# Patient Record
Sex: Female | Born: 1970 | Race: Black or African American | Hispanic: No | Marital: Married | Smoking: Never smoker
Health system: Southern US, Community
[De-identification: ages and names within clinical notes are randomized; demographics above are authoritative.]

## PROBLEM LIST (undated history)

## (undated) DIAGNOSIS — J302 Other seasonal allergic rhinitis: Secondary | ICD-10-CM

## (undated) DIAGNOSIS — D219 Benign neoplasm of connective and other soft tissue, unspecified: Secondary | ICD-10-CM

## (undated) DIAGNOSIS — E669 Obesity, unspecified: Secondary | ICD-10-CM

## (undated) DIAGNOSIS — H35039 Hypertensive retinopathy, unspecified eye: Secondary | ICD-10-CM

## (undated) DIAGNOSIS — G43109 Migraine with aura, not intractable, without status migrainosus: Secondary | ICD-10-CM

## (undated) HISTORY — DX: Hypertensive retinopathy, unspecified eye: H35.039

## (undated) HISTORY — DX: Obesity, unspecified: E66.9

## (undated) HISTORY — PX: ABDOMINAL HYSTERECTOMY: SHX81

## (undated) HISTORY — DX: Migraine with aura, not intractable, without status migrainosus: G43.109

## (undated) HISTORY — DX: Benign neoplasm of connective and other soft tissue, unspecified: D21.9

## (undated) HISTORY — PX: MYOMECTOMY: SHX85

---

## 1998-11-02 ENCOUNTER — Inpatient Hospital Stay (HOSPITAL_COMMUNITY): Admission: AD | Admit: 1998-11-02 | Discharge: 1998-11-02 | Payer: Self-pay | Admitting: Obstetrics and Gynecology

## 1999-04-02 ENCOUNTER — Inpatient Hospital Stay (HOSPITAL_COMMUNITY): Admission: AD | Admit: 1999-04-02 | Discharge: 1999-04-02 | Payer: Self-pay | Admitting: *Deleted

## 1999-04-21 ENCOUNTER — Inpatient Hospital Stay (HOSPITAL_COMMUNITY): Admission: AD | Admit: 1999-04-21 | Discharge: 1999-04-21 | Payer: Self-pay | Admitting: *Deleted

## 1999-05-02 ENCOUNTER — Inpatient Hospital Stay (HOSPITAL_COMMUNITY): Admission: AD | Admit: 1999-05-02 | Discharge: 1999-05-02 | Payer: Self-pay | Admitting: Obstetrics and Gynecology

## 1999-05-03 ENCOUNTER — Encounter: Payer: Self-pay | Admitting: Obstetrics and Gynecology

## 1999-05-08 ENCOUNTER — Inpatient Hospital Stay (HOSPITAL_COMMUNITY): Admission: AD | Admit: 1999-05-08 | Discharge: 1999-05-10 | Payer: Self-pay | Admitting: Obstetrics & Gynecology

## 1999-06-09 ENCOUNTER — Other Ambulatory Visit: Admission: RE | Admit: 1999-06-09 | Discharge: 1999-06-09 | Payer: Self-pay | Admitting: Obstetrics and Gynecology

## 2000-05-10 ENCOUNTER — Inpatient Hospital Stay (HOSPITAL_COMMUNITY): Admission: AD | Admit: 2000-05-10 | Discharge: 2000-05-10 | Payer: Self-pay | Admitting: Obstetrics and Gynecology

## 2000-08-23 ENCOUNTER — Other Ambulatory Visit: Admission: RE | Admit: 2000-08-23 | Discharge: 2000-08-23 | Payer: Self-pay | Admitting: Obstetrics and Gynecology

## 2000-09-17 ENCOUNTER — Ambulatory Visit (HOSPITAL_COMMUNITY): Admission: RE | Admit: 2000-09-17 | Discharge: 2000-09-17 | Payer: Self-pay | Admitting: Obstetrics and Gynecology

## 2000-09-17 ENCOUNTER — Encounter: Payer: Self-pay | Admitting: Obstetrics and Gynecology

## 2000-11-18 ENCOUNTER — Inpatient Hospital Stay (HOSPITAL_COMMUNITY): Admission: AD | Admit: 2000-11-18 | Discharge: 2000-11-18 | Payer: Self-pay | Admitting: Obstetrics and Gynecology

## 2000-12-03 ENCOUNTER — Inpatient Hospital Stay (HOSPITAL_COMMUNITY): Admission: AD | Admit: 2000-12-03 | Discharge: 2000-12-03 | Payer: Self-pay | Admitting: Obstetrics and Gynecology

## 2000-12-03 ENCOUNTER — Encounter: Payer: Self-pay | Admitting: Obstetrics and Gynecology

## 2000-12-03 ENCOUNTER — Ambulatory Visit (HOSPITAL_COMMUNITY): Admission: RE | Admit: 2000-12-03 | Discharge: 2000-12-03 | Payer: Self-pay | Admitting: Obstetrics and Gynecology

## 2001-01-15 ENCOUNTER — Inpatient Hospital Stay (HOSPITAL_COMMUNITY): Admission: AD | Admit: 2001-01-15 | Discharge: 2001-01-15 | Payer: Self-pay | Admitting: Obstetrics and Gynecology

## 2001-02-07 ENCOUNTER — Inpatient Hospital Stay (HOSPITAL_COMMUNITY): Admission: AD | Admit: 2001-02-07 | Discharge: 2001-02-11 | Payer: Self-pay | Admitting: Obstetrics and Gynecology

## 2001-09-29 ENCOUNTER — Other Ambulatory Visit: Admission: RE | Admit: 2001-09-29 | Discharge: 2001-09-29 | Payer: Self-pay | Admitting: Obstetrics and Gynecology

## 2002-06-28 ENCOUNTER — Encounter: Admission: RE | Admit: 2002-06-28 | Discharge: 2002-06-28 | Payer: Self-pay | Admitting: Cardiology

## 2002-06-28 ENCOUNTER — Encounter: Payer: Self-pay | Admitting: Cardiology

## 2003-08-02 ENCOUNTER — Emergency Department (HOSPITAL_COMMUNITY): Admission: EM | Admit: 2003-08-02 | Discharge: 2003-08-02 | Payer: Self-pay | Admitting: Emergency Medicine

## 2004-07-14 ENCOUNTER — Emergency Department (HOSPITAL_COMMUNITY): Admission: EM | Admit: 2004-07-14 | Discharge: 2004-07-14 | Payer: Self-pay | Admitting: Emergency Medicine

## 2004-08-29 ENCOUNTER — Emergency Department (HOSPITAL_COMMUNITY): Admission: EM | Admit: 2004-08-29 | Discharge: 2004-08-29 | Payer: Self-pay | Admitting: Family Medicine

## 2005-04-26 ENCOUNTER — Emergency Department (HOSPITAL_COMMUNITY): Admission: EM | Admit: 2005-04-26 | Discharge: 2005-04-27 | Payer: Self-pay | Admitting: *Deleted

## 2006-01-11 ENCOUNTER — Emergency Department (HOSPITAL_COMMUNITY): Admission: EM | Admit: 2006-01-11 | Discharge: 2006-01-11 | Payer: Self-pay | Admitting: Family Medicine

## 2006-02-05 ENCOUNTER — Emergency Department (HOSPITAL_COMMUNITY): Admission: EM | Admit: 2006-02-05 | Discharge: 2006-02-05 | Payer: Self-pay | Admitting: Family Medicine

## 2006-03-05 ENCOUNTER — Encounter: Admission: RE | Admit: 2006-03-05 | Discharge: 2006-03-05 | Payer: Self-pay | Admitting: Internal Medicine

## 2010-06-15 ENCOUNTER — Encounter: Payer: Self-pay | Admitting: Internal Medicine

## 2010-12-26 DIAGNOSIS — N809 Endometriosis, unspecified: Secondary | ICD-10-CM | POA: Insufficient documentation

## 2011-08-28 DIAGNOSIS — R079 Chest pain, unspecified: Secondary | ICD-10-CM | POA: Insufficient documentation

## 2012-03-02 DIAGNOSIS — Z9989 Dependence on other enabling machines and devices: Secondary | ICD-10-CM | POA: Insufficient documentation

## 2012-03-02 DIAGNOSIS — G4733 Obstructive sleep apnea (adult) (pediatric): Secondary | ICD-10-CM | POA: Insufficient documentation

## 2013-11-01 ENCOUNTER — Encounter: Payer: Self-pay | Admitting: Emergency Medicine

## 2013-11-01 ENCOUNTER — Emergency Department
Admission: EM | Admit: 2013-11-01 | Discharge: 2013-11-01 | Disposition: A | Discharge status: Home / Self Care | Payer: BC Managed Care – PPO | Source: Home / Self Care | Attending: Family Medicine | Admitting: Family Medicine

## 2013-11-01 DIAGNOSIS — R300 Dysuria: Secondary | ICD-10-CM

## 2013-11-01 HISTORY — DX: Other seasonal allergic rhinitis: J30.2

## 2013-11-01 LAB — POCT URINALYSIS DIP (MANUAL ENTRY)
BILIRUBIN UA: NEGATIVE
Glucose, UA: NEGATIVE
Ketones, POC UA: NEGATIVE
LEUKOCYTES UA: NEGATIVE
NITRITE UA: NEGATIVE
PH UA: 6.5 (ref 5–8)
PROTEIN UA: NEGATIVE
Spec Grav, UA: 1.01 (ref 1.005–1.03)
Urobilinogen, UA: 0.2 (ref 0–1)

## 2013-11-01 MED ORDER — NITROFURANTOIN MONOHYD MACRO 100 MG PO CAPS: 100.0000 mg | ORAL_CAPSULE | Freq: Two times a day (BID) | ORAL | Status: DC

## 2013-11-01 NOTE — Discharge Instructions (Signed)
Increase fluid intake.  May take ibuprofen for discomfort. If symptoms become significantly worse during the night or over the weekend, proceed to the local emergency room.    Dysuria Dysuria is the medical term for pain with urination. There are many causes for dysuria, but urinary tract infection is the most common. If a urinalysis was performed it can show that there is a urinary tract infection. A urine culture confirms that you or your child is sick. You will need to follow up with a healthcare provider because:  If a urine culture was done you will need to know the culture results and treatment recommendations.  If the urine culture was positive, you or your child will need to be put on antibiotics or know if the antibiotics prescribed are the right antibiotics for your urinary tract infection.  If the urine culture is negative (no urinary tract infection), then other causes may need to be explored or antibiotics need to be stopped. Today laboratory work may have been done and there does not seem to be an infection. If cultures were done they will take at least 24 to 48 hours to be completed. Today x-rays may have been taken and they read as normal. No cause can be found for the problems. The x-rays may be re-read by a radiologist and you will be contacted if additional findings are made. You or your child may have been put on medications to help with this problem until you can see your primary caregiver. If the problems get better, see your primary caregiver if the problems return. If you were given antibiotics (medications which kill germs), take all of the mediations as directed for the full course of treatment.  If laboratory work was done, you need to find the results. Leave a telephone number where you can be reached. If this is not possible, make sure you find out how you are to get test results. HOME CARE INSTRUCTIONS   Drink lots of fluids. For adults, drink eight, 8 ounce glasses of  clear juice or water a day. For children, replace fluids as suggested by your caregiver.  Empty the bladder often. Avoid holding urine for long periods of time.  After a bowel movement, women should cleanse front to back, using each tissue only once.  Empty your bladder before and after sexual intercourse.  Take all the medicine given to you until it is gone. You may feel better in a few days, but TAKE ALL MEDICINE.  Avoid caffeine, tea, alcohol and carbonated beverages, because they tend to irritate the bladder.  In men, alcohol may irritate the prostate.  Only take over-the-counter or prescription medicines for pain, discomfort, or fever as directed by your caregiver.  If your caregiver has given you a follow-up appointment, it is very important to keep that appointment. Not keeping the appointment could result in a chronic or permanent injury, pain, and disability. If there is any problem keeping the appointment, you must call back to this facility for assistance. SEEK IMMEDIATE MEDICAL CARE IF:   Back pain develops.  A fever develops.  There is nausea (feeling sick to your stomach) or vomiting (throwing up).  Problems are no better with medications or are getting worse. MAKE SURE YOU:   Understand these instructions.  Will watch your condition.  Will get help right away if you are not doing well or get worse. Document Released: 02/07/2004 Document Revised: 08/03/2011 Document Reviewed: 12/15/2007 [facility] Patient Information 2014 [place].

## 2013-11-01 NOTE — ED Notes (Signed)
Pt c/o bilateral flank pain and frequent urination x 1 day. Denies fever.

## 2013-11-01 NOTE — ED Provider Notes (Signed)
CSN: [id number]     Arrival date & time 11/01/13  1655 History   First MD Initiated Contact with Patient 11/01/13 1726     Chief Complaint  Patient presents with  . Flank Pain  . Urinary Frequency      HPI Comments: Patient complains of two day history of urgency, hesitancy, and bilateral flank and back pain.  She has been somewhat fatigued but feels well otherwise.  Patient is a 43 y.o. female presenting with dysuria. The history is provided by the patient.  Dysuria Pain quality:  Burning Pain severity:  Mild Onset quality:  Sudden Duration:  2 days Timing:  Constant Progression:  Worsening Chronicity:  New Recent urinary tract infections: no   Relieved by:  None tried Ineffective treatments:  NSAIDs Urinary symptoms: frequent urination and hesitancy   Urinary symptoms: no discolored urine, no foul-smelling urine, no hematuria and no bladder incontinence   Associated symptoms: flank pain and nausea   Associated symptoms: no abdominal pain, no fever, no genital lesions, no vaginal discharge and no vomiting     Past Medical History  Diagnosis Date  . Seasonal allergies    Past Surgical History  Procedure Laterality Date  . Myomectomy    . Abdominal hysterectomy     Family History  Problem Relation Age of Onset  . Hyperlipidemia Mother   . Hypertension Mother   . Cancer Father     non hodgkins lymphoma   History  Substance Use Topics  . Smoking status: Never Smoker   . Smokeless tobacco: Not on file  . Alcohol Use: No   OB History   Grav Para Term Preterm Abortions TAB SAB Ect Mult Living                 Review of Systems  Constitutional: Negative for fever.  Gastrointestinal: Positive for nausea. Negative for vomiting and abdominal pain.  Genitourinary: Positive for dysuria and flank pain. Negative for vaginal discharge.  All other systems reviewed and are negative.   Allergies  Percocet  Home Medications   Prior to Admission medications   Medication  Sig Start Date End Date Taking? Authorizing Provider  Ascorbic Acid (VITAMIN C PO) Take by mouth.   Yes Historical Provider, MD  Cholecalciferol (VITAMIN D PO) Take by mouth.   Yes Historical Provider, MD  fexofenadine (ALLEGRA) 180 MG tablet Take 180 mg by mouth daily.   Yes Historical Provider, MD  nitrofurantoin, macrocrystal-monohydrate, (MACROBID) 100 MG capsule Take 1 capsule (100 mg total) by mouth 2 (two) times daily. Take with food. 11/01/13   Kandra Nicolas, MD   BP 116/74  Pulse 66  Temp(Src) 98.2 F (36.8 C) (Oral)  Resp 16  Ht 5' 3.5" (1.613 m)  Wt 237 lb (107.502 kg)  BMI 41.32 kg/m2  SpO2 98% Physical Exam Nursing notes and Vital Signs reviewed. Appearance:  Patient appears stated age, and in no acute distress.  Patient is obese (BMI 41.3) Eyes:  Pupils are equal, round, and reactive to light and accomodation.  Extraocular movement is intact.  Conjunctivae are not inflamed  Pharynx:  Normal Neck:  Supple.  No adenopathy Lungs:  Clear to auscultation.  Breath sounds are equal.  Heart:  Regular rate and rhythm without murmurs, rubs, or gallops.  Abdomen:  Nontender without masses or hepatosplenomegaly.  Bowel sounds are present.  No CVA or flank tenderness.  Extremities:  No edema.  No calf tenderness Skin:  No rash present.   ED Course  Procedures  none    Labs Reviewed  URINE CULTURE  POCT URINALYSIS DIP (MANUAL ENTRY):  BLO trace-lysed, otherwise negative      MDM   1. Dysuria; ?cystitis    Urine culture pending.  Begin Macrobid. Increase fluid intake.  May take ibuprofen for discomfort. If symptoms become significantly worse during the night or over the weekend, proceed to the local emergency room.  Followup with Family Doctor if not improved in one week.     Kandra Nicolas, MD 11/01/13 2059

## 2013-11-03 LAB — URINE CULTURE: Colony Count: 60000

## 2013-11-04 ENCOUNTER — Telehealth: Payer: Self-pay | Admitting: *Deleted

## 2014-06-12 ENCOUNTER — Encounter: Payer: BC Managed Care – PPO | Admitting: Obstetrics & Gynecology

## 2014-07-02 ENCOUNTER — Ambulatory Visit (INDEPENDENT_AMBULATORY_CARE_PROVIDER_SITE_OTHER): Payer: BC Managed Care – PPO | Admitting: Obstetrics & Gynecology

## 2014-07-02 ENCOUNTER — Encounter: Payer: Self-pay | Admitting: Obstetrics & Gynecology

## 2014-07-02 VITALS — BP 129/76 | HR 70 | Resp 16 | Ht 63.25 in | Wt 242.0 lb

## 2014-07-02 DIAGNOSIS — Z90710 Acquired absence of both cervix and uterus: Secondary | ICD-10-CM

## 2014-07-02 DIAGNOSIS — Z6841 Body Mass Index (BMI) 40.0 and over, adult: Secondary | ICD-10-CM

## 2014-07-02 DIAGNOSIS — Z01419 Encounter for gynecological examination (general) (routine) without abnormal findings: Secondary | ICD-10-CM | POA: Diagnosis not present

## 2014-07-02 DIAGNOSIS — Z0000 Encounter for general adult medical examination without abnormal findings: Secondary | ICD-10-CM

## 2014-07-02 NOTE — Progress Notes (Signed)
Subjective:    Vickie Castillo is a 44 y.o.M P4 (09,47,09,62)  female who presents for an annual exam. The patient has no complaints today. The patient is sexually active. GYN screening history: last pap: was normal. The patient wears seatbelts: yes. The patient participates in regular exercise: yes. Has the patient ever been transfused or tattooed?: no. The patient reports that there is not domestic violence in her life.   Menstrual History: OB History    Gravida Para Term Preterm AB TAB SAB Ectopic Multiple Living   5 4 4  1  1   4       Menarche age: 81  No LMP recorded. Patient has had a hysterectomy.    The following portions of the patient's history were reviewed and updated as appropriate: allergies, current medications, past family history, past medical history, past social history, past surgical history and problem list.  Review of Systems A comprehensive review of systems was negative. married for 21 years, denies dyspareunia. Works at [employer], She has had a flu vaccine this season. Last mammogram 1/15   Objective:    BP 129/76 mmHg  Pulse 70  Resp 16  Ht 5' 3.25" (1.607 m)  Wt 242 lb (109.77 kg)  BMI 42.51 kg/m2  General Appearance:    Alert, cooperative, no distress, appears stated age  Head:    Normocephalic, without obvious abnormality, atraumatic  Eyes:    PERRL, conjunctiva/corneas clear, EOM's intact, fundi    benign, both eyes  Ears:    Normal TM's and external ear canals, both ears  Nose:   Nares normal, septum midline, mucosa normal, no drainage    or sinus tenderness  Throat:   Lips, mucosa, and tongue normal; teeth and gums normal  Neck:   Supple, symmetrical, trachea midline, no adenopathy;    thyroid:  no enlargement/tenderness/nodules; no carotid   bruit or JVD  Back:     Symmetric, no curvature, ROM normal, no CVA tenderness  Lungs:     Clear to auscultation bilaterally, respirations unlabored  Chest Wall:    No tenderness or deformity    Heart:    Regular rate and rhythm, S1 and S2 normal, no murmur, rub   or gallop  Breast Exam:    No tenderness, masses, or nipple abnormality  Abdomen:     Soft, non-tender, bowel sounds active all four quadrants,    no masses, no organomegaly  Genitalia:    Normal female without lesion, discharge or tenderness, normal cuff and bimanual     Extremities:   Extremities normal, atraumatic, no cyanosis or edema  Pulses:   2+ and symmetric all extremities  Skin:   Skin color, texture, turgor normal, no rashes or lesions  Lymph nodes:   Cervical, supraclavicular, and axillary nodes normal  Neurologic:   CNII-XII intact, normal strength, sensation and reflexes    throughout  .    Assessment:    Healthy female exam.    Plan:     Breast self exam technique reviewed and patient encouraged to perform self-exam monthly. Mammogram.

## 2014-07-25 ENCOUNTER — Ambulatory Visit (INDEPENDENT_AMBULATORY_CARE_PROVIDER_SITE_OTHER): Payer: BC Managed Care – PPO

## 2014-07-25 DIAGNOSIS — Z0000 Encounter for general adult medical examination without abnormal findings: Secondary | ICD-10-CM

## 2014-07-25 DIAGNOSIS — Z1231 Encounter for screening mammogram for malignant neoplasm of breast: Secondary | ICD-10-CM | POA: Diagnosis not present

## 2014-08-02 ENCOUNTER — Ambulatory Visit: Payer: BC Managed Care – PPO | Admitting: Physical Therapy

## 2015-01-14 ENCOUNTER — Ambulatory Visit (INDEPENDENT_AMBULATORY_CARE_PROVIDER_SITE_OTHER): Payer: BC Managed Care – PPO

## 2015-01-14 ENCOUNTER — Encounter: Payer: Self-pay | Admitting: Sports Medicine

## 2015-01-14 ENCOUNTER — Ambulatory Visit (INDEPENDENT_AMBULATORY_CARE_PROVIDER_SITE_OTHER): Payer: BC Managed Care – PPO | Admitting: Sports Medicine

## 2015-01-14 VITALS — BP 142/84 | HR 84 | Ht 63.0 in | Wt 242.0 lb

## 2015-01-14 DIAGNOSIS — M25562 Pain in left knee: Secondary | ICD-10-CM

## 2015-01-14 DIAGNOSIS — M170 Bilateral primary osteoarthritis of knee: Secondary | ICD-10-CM | POA: Insufficient documentation

## 2015-01-14 DIAGNOSIS — M1711 Unilateral primary osteoarthritis, right knee: Secondary | ICD-10-CM | POA: Diagnosis not present

## 2015-01-14 DIAGNOSIS — M5416 Radiculopathy, lumbar region: Secondary | ICD-10-CM

## 2015-01-14 DIAGNOSIS — M25561 Pain in right knee: Secondary | ICD-10-CM | POA: Diagnosis not present

## 2015-01-14 DIAGNOSIS — M5136 Other intervertebral disc degeneration, lumbar region: Secondary | ICD-10-CM | POA: Diagnosis not present

## 2015-01-14 DIAGNOSIS — G8929 Other chronic pain: Secondary | ICD-10-CM | POA: Insufficient documentation

## 2015-01-14 MED ORDER — TRAMADOL HCL 50 MG PO TABS
ORAL_TABLET | ORAL | Status: DC
Start: 2015-01-14 — End: 2015-01-18

## 2015-01-14 NOTE — Progress Notes (Signed)
   Subjective:    I'm seeing this patient as a consultation for:  [facility] emergency department, Dr. 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 Her symptoms have improved over 50%, but she continues to have right-sided L4 distribution numbness and radiculopathy. No trauma.  Left knee pain: Moderate, persistent, posterior medial, no mechanical symptoms, worse with squatting and flexion.  Past medical history, Surgical history, Family history not pertinant except as noted below, Social history, Allergies, and medications have been entered into the medical record, reviewed, and no changes needed.   Review of Systems: No headache, visual changes, nausea, vomiting, diarrhea, constipation, dizziness, abdominal pain, skin rash, fevers, chills, night sweats, weight loss, swollen lymph nodes, body aches, joint swelling, muscle aches, chest pain, shortness of breath, mood changes, visual or auditory hallucinations.   Objective:   General: Well Developed, well nourished, and in no acute distress.  Neuro/Psych: Alert and oriented x3, extra-ocular muscles intact, able to move all 4 extremities, sensation grossly intact. Skin: Warm and dry, no rashes noted.  Respiratory: Not using accessory muscles, speaking in full sentences, trachea midline.  Cardiovascular: Pulses palpable, no extremity edema. Abdomen: Does not appear distended. Back Exam:  Inspection: Unremarkable  Motion: Flexion 45 deg, Extension 45 deg, Side Bending to 45 deg bilaterally,  Rotation to 45 deg bilaterally  SLR laying: Negative  XSLR laying: Negative  Palpable  tenderness: None. FABER: negative. Sensory change: Gross sensation intact to all lumbar and sacral dermatomes.  Reflexes: 2+ at both patellar tendons, 2+ at achilles tendons, Babinski's downgoing.  Strength at foot  Plantar-flexion: 5/5 Dorsi-flexion: 5/5 Eversion: 5/5 Inversion: 5/5  Leg strength  Quad: 5/5 Hamstring: 5/5 Hip flexor: 5/5 Hip abductors: 5/5  Gait unremarkable. Left Knee: Normal to inspection with no erythema or effusion or obvious bony abnormalities. Tender to palpation at the medial joint line ROM normal in flexion and extension and lower leg rotation. Ligaments with solid consistent endpoints including ACL, PCL, LCL, MCL. Negative Mcmurray's and provocative meniscal tests. Non painful patellar compression. Patellar and quadriceps tendons unremarkable. Hamstring and quadriceps strength is normal.   x-rays personally reviewed, there is widespread degenerative disc disease, facet arthritis as well as degenerative scoliosis, knee x-rays show medial compartment DJD worse in the left knee.   Impression and Recommendations:   This case required medical decision making of moderate complexity.

## 2015-01-14 NOTE — Assessment & Plan Note (Signed)
Improving with a course of prednisone prescribed in the emergency apartment. Clinically right L4. Formal physical therapy, x-rays. Return in one month, MRI for intervention at no better. She is getting nausea with hydrocodone, switching down to tramadol.

## 2015-01-14 NOTE — Assessment & Plan Note (Signed)
X-rays, tramadol, injection of no better in one month.

## 2015-01-16 ENCOUNTER — Encounter: Payer: Self-pay | Admitting: Rehabilitative and Restorative Service Providers"

## 2015-01-16 ENCOUNTER — Ambulatory Visit (INDEPENDENT_AMBULATORY_CARE_PROVIDER_SITE_OTHER): Payer: BC Managed Care – PPO | Admitting: Rehabilitative and Restorative Service Providers"

## 2015-01-16 DIAGNOSIS — Z7409 Other reduced mobility: Secondary | ICD-10-CM

## 2015-01-16 DIAGNOSIS — M5416 Radiculopathy, lumbar region: Secondary | ICD-10-CM | POA: Diagnosis not present

## 2015-01-16 DIAGNOSIS — R29898 Other symptoms and signs involving the musculoskeletal system: Secondary | ICD-10-CM

## 2015-01-16 DIAGNOSIS — M623 Immobility syndrome (paraplegic): Secondary | ICD-10-CM | POA: Diagnosis not present

## 2015-01-16 DIAGNOSIS — M2560 Stiffness of unspecified joint, not elsewhere classified: Secondary | ICD-10-CM

## 2015-01-16 DIAGNOSIS — R531 Weakness: Secondary | ICD-10-CM

## 2015-01-16 DIAGNOSIS — IMO0001 Reserved for inherently not codable concepts without codable children: Secondary | ICD-10-CM

## 2015-01-16 DIAGNOSIS — R6889 Other general symptoms and signs: Secondary | ICD-10-CM

## 2015-01-16 NOTE — Patient Instructions (Signed)
3 part core   With neutral spine, tighten pelvic floor and abdominals, tighten muscles in back at waist. Hold 10 sec. Repeat _10__ times. Do _several__ times a day. Progress to do this sitting, standing, walking, with functional activities.   HIP: Hamstrings - Supine  Bend knee on one side Place strap around foot. Bend left knee foot flat on surface. Raise right  leg up, keep knee straight. Hold __30_ seconds. __3_ reps per set, __2_ sets per day  Abdominal Lying   Lie on abdomen, pillows as needed under hips, ankles, forehead and chest. Rest forehead on hands or on folded towel as needed and/or instructed by your therapist. Lie __1-2_ minutes working toward 5 min.  Trunk: Prone Extension (Press-Ups)   Lie on stomach on firm, flat surface. Relax bottom and legs. Raise chest in air with elbows straight. Keep hips flat on surface, sag stomach. Hold _2___ seconds. Repeat __10__ times. Do _4-5___ sessions per day. CAUTION: Movement should be gentle and slow.  Keep trunk in line Avoid trunk rotation; bending; lifting; bent forward positions; recliner; comfy sofas that put you in the big C position.

## 2015-01-16 NOTE — Therapy (Signed)
[place], [place], [address] Phone: [phone]   Fax:  [phone]  Physical Therapy Evaluation  Patient Details  Name: Vickie Castillo MRN: [id number] Date of Birth: 20-Feb-1971 Referring Provider:  Silverio Decamp,*  Encounter Date: 01/16/2015      PT End of Session - 01/16/15 1206    Visit Number 1   Number of Visits 16   Date for PT Re-Evaluation 03/13/15   PT Start Time 1026   PT Stop Time 1142   PT Time Calculation (min) 76 min   Activity Tolerance Patient tolerated treatment well  decreased pain post treatment      Past Medical History  Diagnosis Date  . Seasonal allergies   . Fibroids   . Obesity     Past Surgical History  Procedure Laterality Date  . Myomectomy    . Abdominal hysterectomy    . Cesarean section      There were no vitals filed for this visit.  Visit Diagnosis:  Lumbar radiculopathy - Plan: PT plan of care cert/re-cert  Weakness of right lower extremity - Plan: PT plan of care cert/re-cert  Stiffness due to immobility - Plan: PT plan of care cert/re-cert  Radicular pain of right lower back - Plan: PT plan of care cert/re-cert  Decreased strength, endurance, and mobility - Plan: PT plan of care cert/re-cert      Subjective Assessment - 01/16/15 1025    Subjective Patient reports pain in the Rt glut/buttocks for the past 6 months. Slept in a different bed Saturday night 01/12/15. She was seen in the ED and received injections and prednisone. Seen by Dr. Darene Lamer yesterday. Pain in the Rt buttocks/hip radiating down the Rt LE - sometimes back of knee/sometimes front of thigh.   Pertinent History LBP with pain into the Rt LE intermittently over the past 6 months. Arthritis bilat knees   How long can you sit comfortably? 5-10 min   How long can you stand comfortably? 30 min   How long can you walk comfortably? 5-10 min    Diagnostic tests xray - lumbar disc -  DDD   Currently in Pain? Yes   Pain Score 7    Pain Location Hip   Pain Orientation Right   Pain Descriptors / Indicators Dull;Burning;Sharp  sharp shot with turning certain ways   Pain Type Chronic pain   Pain Radiating Towards down back of Rt LE into the Rt foot/toes are numb    Pain Onset In the past 7 days   Pain Frequency Constant  since Sunday   Aggravating Factors  prolonged sitting; bending; lifting; reaching; trying to find a position for sleep   Pain Relieving Factors lying down; on all 4"s with pillow under chest            [facility] PT Assessment - 01/16/15 0001    Assessment   Medical Diagnosis Rt lumbar radiculopathy   Onset Date/Surgical Date 01/12/15   Hand Dominance Right   Next MD Visit 02/11/15   Restrictions   Other Position/Activity Restrictions no lifting/pulling/pushing   Balance Screen   Has the patient fallen in the past 6 months No   Has the patient had a decrease in activity level because of a fear of falling?  No   Is the patient reluctant to leave their home because of a fear of falling?  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   Leisure household chores/4 children- church   Observation/Other Assessments   Focus on Therapeutic Outcomes (FOTO)  71% limitation   Sensation   Additional Comments numbness in great toe and 2nd/3rd toes - at times in foot on Rt   Posture/Postural Control   Posture Comments standing with weight shifted to Lt   AROM   Right/Left Hip --  ROM limited by obesity   Lumbar Flexion 75%  pain into Rt LE   Lumbar Extension 40%  pain into Rt LE > than forwward flexion   Lumbar - Right Side Bend 60%   Lumbar - Left Side Bend 55%   Lumbar - Right Rotation 20%   Lumbar - Left Rotation 25%  pull Rt buttocks    Strength   Strength Assessment Site --  Rt great toe extension 3-/5 to 3/3   Right/Left Hip --  5/5 except as noted   Right Hip Extension 4+/5   Right Hip ABduction 4/5   Flexibility   Hamstrings 90 deg Lt; 65 deg Rt   Quadriceps knee flex in prone 90 deg Rt; 75 deg Lt   ITB tightness and radicular pain Rt; tight Lt   Piriformis tightness bilat Rt>>Lt with radicular symptoms with Rt piriformis stretch position supine   Palpation   Spinal mobility pain with PA spring testing sacrum/L5/L4 and Rt lateral spring testing L5/L4   SI assessment  sensitive Rt   Palpation comment muscular tightness and pain Rt QL/piriformis/hip abductors sacrum to greater trochanter. Pain in Rt buttocks with palpation through Lt hip abductors/extensors   FABER test   findings Negative   Comment tightness with position for testing   Prone Knee Bend Test   Findings Positive   Side Right   Comment pain into anterior Rt thigh and posterior hip/buttock area   Straight Leg Raise   Findings Positive   Side  Right   Comment radicular pain Rt LE at ~75 deg SLR on Rt   Ambulation/Gait   Gait Comments antalgic gait with decreased weight bearing Rt LE                   [facility] Adult PT Treatment/Exercise - 01/16/15 0001    Self-Care   Self-Care --  spine care/positioning/activities   Exercises   Exercises --  myofacial ball release work standing   Lumbar Exercises: Stretches   Passive Hamstring Stretch 3 reps;30 seconds   Passive Hamstring Stretch Limitations supine oppisite knee bent w/ strap   Prone on Elbows Stretch Limitations prone lying flat 2-5 min   Press Ups Limitations partial press up 1-2 sec pause 10 reps   Lumbar Exercises: Supine   AB Set Limitations 3 part core 10 sec hold 10 reps   Cryotherapy   Number Minutes Cryotherapy 15 Minutes   Cryotherapy Location Lumbar Spine;Hip   Type of Cryotherapy Ice pack   Electrical Stimulation   Electrical Stimulation Location L4/5 bilat/Rt  piriformis/hip abductors   Electrical Stimulation Action IFC   Electrical Stimulation Parameters to tolerance   Electrical Stimulation Goals Pain                PT Education - 01/16/15 1134    Education provided Yes   Education Details Spine rehab/extension principle; neutral spine; core stabilization; HEP   Person(s) Educated Patient   Methods Explanation;Demonstration;Tactile cues;Verbal cues;Handout  Comprehension Verbalized understanding;Returned demonstration;Verbal cues required;Tactile cues required          PT Short Term Goals - 01/16/15 1215    PT SHORT TERM GOAL #1   Title Patient verbalizes/demonstrates sfe spine positions/movements 02/13/15   Time 4   Period Weeks   Status New   PT SHORT TERM GOAL #2   Title Increase Rt hamstring flexibility to 90 degrees without radicular symptoms 02/13/15   Time 4   Period Weeks   Status New   PT SHORT TERM GOAL #3   Title Patient to tolerate 45 min sitting; 15 min standing; 15 min walking 02/13/15   Time 4   Period Weeks   Status New   PT SHORT TERM GOAL #4   Title Centralizatioin of Rt LE radicular symptoms by patient report  02/13/15   Time 4   Period Weeks   Status New           PT Long Term Goals - 01/16/15 1219    PT LONG TERM GOAL #1   Title Patient I in HEP for discharge 03/13/15   Time 8   Period Weeks   PT LONG TERM GOAL #2   Title 5-/5 to 5/5 strength Rt LE 03/13/15   Time 8   Period Weeks   Status New   PT LONG TERM GOAL #3   Title Pain decreased to 0/10 to 2/10 90% of day 03/13/15   Time 8   Period Weeks   Status New   PT LONG TERM GOAL #4   Title Decrease FOTO to </= 44% limitatiion 03/13/15   Time 8   Period Weeks   Status New               Plan - 01/16/15 1208    Clinical Impression Statement Patient presents with discogenic LBP with Rt LE radiculopathy intermittently through anterior thigh and posterior thigh to leg/foot at times.She has pain; limited mobility;  decresaed strength; decresead functional activity level; inability to sleep and rest. She will benefit from Physical Therapy to address problems and progress to return to full functional activity level.    Pt will benefit from skilled therapeutic intervention in order to improve on the following deficits Pain;Decreased range of motion;Decreased strength;Abnormal gait;Improper body mechanics;Decreased activity tolerance   Rehab Potential Good   PT Frequency 2x / week   PT Duration 8 weeks   PT Treatment/Interventions Patient/family education;ADLs/Self Care Home Management;Therapeutic exercise;Therapeutic activities;Manual techniques;Cryotherapy;Electrical Stimulation;Moist Heat;Ultrasound;Traction;Neuromuscular re-education;Dry needling   PT Next Visit Plan review HEP; continue spine education; add standing trunk extension; progress with core stabilization   PT Home Exercise Plan spine care; erogonomic positioinal changes; 3 part core; stretching; extensino program   Consulted and Agree with Plan of Care Patient         Problem List Patient Active Problem List   Diagnosis Date Noted  . Right lumbar radiculopathy 01/14/2015  . Primary osteoarthritis of right knee 01/14/2015  . Obesity     Jhamari Markowicz Nilda Simmer, PT, MPH 01/16/2015, 12:25 PM  [facility] [place], [place], [address] Phone: [phone]   Fax:  [phone]

## 2015-01-17 ENCOUNTER — Telehealth: Payer: Self-pay

## 2015-01-17 DIAGNOSIS — M5416 Radiculopathy, lumbar region: Secondary | ICD-10-CM

## 2015-01-17 MED ORDER — CYCLOBENZAPRINE HCL 10 MG PO TABS
ORAL_TABLET | ORAL | Status: DC
Start: 2015-01-17 — End: 2015-01-18

## 2015-01-17 MED ORDER — MELOXICAM 15 MG PO TABS: ORAL_TABLET | ORAL | Status: DC

## 2015-01-17 NOTE — Telephone Encounter (Signed)
Vickie Castillo states the tramadol is not working well. She is still having low back and left leg pain with numbness in right foot. She did get some relief after PT but was short relief.

## 2015-01-17 NOTE — Telephone Encounter (Signed)
I'm going to add Flexeril and meloxicam, PT takes time.

## 2015-01-18 ENCOUNTER — Ambulatory Visit (INDEPENDENT_AMBULATORY_CARE_PROVIDER_SITE_OTHER): Payer: BC Managed Care – PPO | Admitting: Rehabilitative and Restorative Service Providers"

## 2015-01-18 ENCOUNTER — Encounter: Payer: Self-pay | Admitting: Sports Medicine

## 2015-01-18 ENCOUNTER — Ambulatory Visit (INDEPENDENT_AMBULATORY_CARE_PROVIDER_SITE_OTHER): Payer: BC Managed Care – PPO | Admitting: Sports Medicine

## 2015-01-18 ENCOUNTER — Encounter: Payer: Self-pay | Admitting: Rehabilitative and Restorative Service Providers"

## 2015-01-18 DIAGNOSIS — M623 Immobility syndrome (paraplegic): Secondary | ICD-10-CM

## 2015-01-18 DIAGNOSIS — M5416 Radiculopathy, lumbar region: Secondary | ICD-10-CM

## 2015-01-18 DIAGNOSIS — R29898 Other symptoms and signs involving the musculoskeletal system: Secondary | ICD-10-CM | POA: Diagnosis not present

## 2015-01-18 DIAGNOSIS — R531 Weakness: Secondary | ICD-10-CM

## 2015-01-18 DIAGNOSIS — M2560 Stiffness of unspecified joint, not elsewhere classified: Secondary | ICD-10-CM

## 2015-01-18 DIAGNOSIS — IMO0001 Reserved for inherently not codable concepts without codable children: Secondary | ICD-10-CM

## 2015-01-18 DIAGNOSIS — Z7409 Other reduced mobility: Secondary | ICD-10-CM | POA: Diagnosis not present

## 2015-01-18 DIAGNOSIS — R6889 Other general symptoms and signs: Secondary | ICD-10-CM

## 2015-01-18 MED ORDER — ONDANSETRON 8 MG PO TBDP: 8.0000 mg | ORAL_TABLET | Freq: Three times a day (TID) | ORAL | Status: DC | PRN

## 2015-01-18 MED ORDER — HYDROMORPHONE HCL 2 MG PO TABS: ORAL_TABLET | ORAL | Status: DC

## 2015-01-18 MED ORDER — GABAPENTIN 300 MG PO CAPS: ORAL_CAPSULE | ORAL | Status: DC

## 2015-01-18 NOTE — Patient Instructions (Addendum)
(  Clinic) Knee: Flexion / Hamstring Curl - Prone   Pull one foot toward buttocks. Pause. Repeat with opposite leg )no pulley/no weight) Repeat __10__ times per set. Do _1-3___ sets per session. Do _2-3___ sessions per day.   Cat / Cow Flow   Inhale, press spine toward ceiling like a Halloween cat. Keeping strength in arms and abdominals, exhale to soften spine through neutral and into cow pose. Open chest and arch back. Initiate movement between cat and cow at tailbone, one vertebrae at a time. Repeat _10___ times.    IT band stretch Hamstring stretch then pull leg across body  Hold 30 sec  3 reps

## 2015-01-18 NOTE — Assessment & Plan Note (Signed)
Persistent symptoms of right L4 radiculopathy. At this point we are going to proceed with an early MRI, for interventional planning will likely take place as a right L4-L5 interlaminar epidural. Discontinue Flexeril, and I am going to add hydromorphone, Zofran as needed for nausea, as well as gabapentin for neuropathic type pain. Return to see me to go over MRI results, and order the epidural.

## 2015-01-18 NOTE — Telephone Encounter (Signed)
Patient advised at office visit.

## 2015-01-18 NOTE — Therapy (Signed)
[facility] [place], [place], [address] Phone: [phone]   Fax:  [phone]  Physical Therapy Treatment  Patient Details  Name: BRITANEY ESPAILLAT MRN: [id number] Date of Birth: November 15, 1970 Referring Provider:  Silverio Decamp,*  Encounter Date: 01/18/2015      PT End of Session - 01/18/15 0842    Visit Number 2   Number of Visits 16   Date for PT Re-Evaluation 03/13/15   PT Start Time 0806   PT Stop Time 1324   PT Time Calculation (min) 51 min      Past Medical History  Diagnosis Date  . Seasonal allergies   . Fibroids   . Obesity     Past Surgical History  Procedure Laterality Date  . Myomectomy    . Abdominal hysterectomy    . Cesarean section      There were no vitals filed for this visit.  Visit Diagnosis:  Lumbar radiculopathy  Weakness of right lower extremity  Stiffness due to immobility  Radicular pain of right lower back  Decreased strength, endurance, and mobility      Subjective Assessment - 01/18/15 0810    Subjective Patient reports continued pain in the back and radiating into Rt LE. Can't sleep well - unable to find a comfortable position. Doing some of the exercises.    Currently in Pain? Yes   Pain Score 7    Pain Location Hip   Pain Orientation Right   Pain Descriptors / Indicators Dull;Numbness   Pain Type Chronic pain   Pain Radiating Towards lower leg/front of leg into the big toe/numb in big toe and next toe                         [facility] Adult PT Treatment/Exercise - 01/18/15 0001    Lumbar Exercises: Stretches   Passive Hamstring Stretch 3 reps;30 seconds   Passive Hamstring Stretch Limitations supine oppisite knee bent w/ strap   Prone on Elbows Stretch Limitations prone lying flat 2-5 min   Press Ups Limitations partial press up 1-2 sec pause 10 reps   Lumbar Exercises: Supine   AB Set Limitations 3 part core 10 sec hold 10 reps   verbal cues for correct technique   Lumbar Exercises: Prone   Other Prone Lumbar Exercises knee flexion alt 10 each    Lumbar Exercises: Quadruped   Madcat/Old Horse 10 reps   Cryotherapy   Number Minutes Cryotherapy 20 Minutes   Cryotherapy Location Lumbar Spine   Type of Cryotherapy Ice pack   Electrical Stimulation   Electrical Stimulation Location L4/5 bilat/Rt piriformis/hip abductors   Electrical Stimulation Action IFC   Electrical Stimulation Parameters to tolerance   Electrical Stimulation Goals Pain   Manual Therapy   Joint Mobilization L5/L4 grade II PA and Rt lat mobs   Soft tissue mobilization working through [employer] hip/buttock/piriformis/lumbar spine   Myofascial Release Rt hip/buttock                PT Education - 01/18/15 0839    Education provided Yes   Education Details Continued spine care education; [place]ie extension exercise program; core stabilization; stretching in neutral position   Person(s) Educated Patient   Methods Explanation;Demonstration;Tactile cues;Verbal cues;Handout   Comprehension Verbalized understanding;Returned demonstration;Verbal cues required;Tactile cues required          PT Short Term Goals - 01/16/15 1215    PT SHORT TERM GOAL #1  Title Patient verbalizes/demonstrates sfe spine positions/movements 02/13/15   Time 4   Period Weeks   Status New   PT SHORT TERM GOAL #2   Title Increase Rt hamstring flexibility to 90 degrees without radicular symptoms 02/13/15   Time 4   Period Weeks   Status New   PT SHORT TERM GOAL #3   Title Patient to tolerate 45 min sitting; 15 min standing; 15 min walking 02/13/15   Time 4   Period Weeks   Status New   PT SHORT TERM GOAL #4   Title Centralizatioin of Rt LE radicular symptoms by patient report  02/13/15   Time 4   Period Weeks   Status New           PT Long Term Goals - 01/16/15 1219    PT LONG TERM GOAL #1   Title Patient I in HEP for discharge 03/13/15   Time 8    Period Weeks   PT LONG TERM GOAL #2   Title 5-/5 to 5/5 strength Rt LE 03/13/15   Time 8   Period Weeks   Status New   PT LONG TERM GOAL #3   Title Pain decreased to 0/10 to 2/10 90% of day 03/13/15   Time 8   Period Weeks   Status New   PT LONG TERM GOAL #4   Title Decrease FOTO to </= 44% limitatiion 03/13/15   Time 8   Period Weeks   Status New               Plan - 01/18/15 3664    Clinical Impression Statement Continued discogenic symptoms Rt LB and LE.    Pt will benefit from skilled therapeutic intervention in order to improve on the following deficits Pain;Decreased range of motion;Decreased strength;Abnormal gait;Improper body mechanics;Decreased activity tolerance   Rehab Potential Good   PT Frequency 2x / week   PT Duration 8 weeks   PT Treatment/Interventions Patient/family education;ADLs/Self Care Home Management;Therapeutic exercise;Therapeutic activities;Manual techniques;Cryotherapy;Electrical Stimulation;Moist Heat;Ultrasound;Traction;Neuromuscular re-education;Dry needling   PT Next Visit Plan review HEP; continue spine education; add standing trunk extension; progress with core stabilization   PT Home Exercise Plan spine care; erogonomic positioinal changes; 3 part core; stretching; extension program   Consulted and Agree with Plan of Care Patient        Problem List Patient Active Problem List   Diagnosis Date Noted  . Right lumbar radiculopathy 01/14/2015  . Primary osteoarthritis of right knee 01/14/2015  . Obesity     Celyn Nilda Simmer, PT, MPH 01/18/2015, 9:08 AM  [facility] [place], [place], [address] Phone: [phone]   Fax:  [phone]

## 2015-01-18 NOTE — Progress Notes (Signed)
  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 She does desire to proceed rather quickly to interventional treatment. Pain is severe, persistent with the same radiation as before, no constitutional symptoms, no bowel or bladder dysfunction.  Past medical history, Surgical history, Family history not pertinant except as noted below, Social history, Allergies, and medications have been entered into the medical record, reviewed, and no changes needed.   Review of Systems: No fevers, chills, night sweats, weight loss, chest pain, or shortness of breath.   Objective:    General: Well Developed, well nourished, and in no acute distress.  Neuro: Alert and oriented x3, extra-ocular muscles intact, sensation grossly intact.  HEENT: Normocephalic, atraumatic, pupils equal round reactive to light, neck supple, no masses, no lymphadenopathy, thyroid nonpalpable.  Skin: Warm and dry, no rashes. Cardiac: Regular rate and rhythm, no murmurs rubs or gallops, no lower extremity edema.  Respiratory: Clear to auscultation bilaterally. Not using accessory muscles, speaking in full sentences. Back Exam:  Inspection: Unremarkable  Motion: Flexion 45 deg, Extension 45 deg, Side Bending to 45 deg bilaterally,  Rotation to 45 deg bilaterally  SLR laying: Negative  XSLR laying: Negative  Palpable tenderness: None. FABER: negative. Sensory change: Gross sensation intact to all lumbar and sacral dermatomes.  Reflexes: 2+ at both patellar tendons, 2+ at achilles tendons, Babinski's downgoing.  Strength at foot  Plantar-flexion: 5/5 Dorsi-flexion: 5/5 Eversion: 5/5 Inversion: 5/5  Leg strength  Quad: 5/5 Hamstring: 5/5 Hip flexor: 5/5 Hip abductors: 5/5  Gait unremarkable.  Impression  and Recommendations:

## 2015-01-18 NOTE — Telephone Encounter (Signed)
Patient states she has already been taking mobic and fexeril. She would like an injection.

## 2015-01-18 NOTE — Telephone Encounter (Signed)
No problem, I can set this up, it is a bit early however we will continue with therapy, before we can do an injection we need to get an MRI. I will order the MRI, after the MRI she needs to come see me to go over the results, and at that point we determine which level needs to be injected.

## 2015-01-21 ENCOUNTER — Telehealth: Payer: Self-pay

## 2015-01-21 ENCOUNTER — Encounter: Payer: BC Managed Care – PPO | Admitting: Rehabilitative and Restorative Service Providers"

## 2015-01-21 NOTE — Telephone Encounter (Signed)
Urgent, but not stat.ASAP would be good.

## 2015-01-21 NOTE — Telephone Encounter (Signed)
Patient called; She thought the MRI was going to be ordered STAT. Did you want her to have the MRI ASAP?

## 2015-01-22 NOTE — Telephone Encounter (Signed)
Patient advised.

## 2015-01-24 ENCOUNTER — Ambulatory Visit (INDEPENDENT_AMBULATORY_CARE_PROVIDER_SITE_OTHER): Payer: BC Managed Care – PPO | Admitting: Physical Therapy

## 2015-01-24 DIAGNOSIS — M5416 Radiculopathy, lumbar region: Secondary | ICD-10-CM

## 2015-01-24 DIAGNOSIS — Z7409 Other reduced mobility: Secondary | ICD-10-CM | POA: Diagnosis not present

## 2015-01-24 DIAGNOSIS — R531 Weakness: Secondary | ICD-10-CM

## 2015-01-24 DIAGNOSIS — IMO0001 Reserved for inherently not codable concepts without codable children: Secondary | ICD-10-CM

## 2015-01-24 DIAGNOSIS — R29898 Other symptoms and signs involving the musculoskeletal system: Secondary | ICD-10-CM | POA: Diagnosis not present

## 2015-01-24 DIAGNOSIS — M2560 Stiffness of unspecified joint, not elsewhere classified: Secondary | ICD-10-CM

## 2015-01-24 DIAGNOSIS — M623 Immobility syndrome (paraplegic): Secondary | ICD-10-CM

## 2015-01-24 NOTE — Therapy (Signed)
[facility] [place], [place], [address] Phone: [phone]   Fax:  [phone]  Physical Therapy Treatment  Patient Details  Name: Vickie Castillo MRN: [id number] Date of Birth: 08/31/1970 Referring Provider:  Silverio Decamp,*  Encounter Date: 01/24/2015      PT End of Session - 01/24/15 1534    Visit Number 3   Number of Visits 16   Date for PT Re-Evaluation 03/13/15   PT Start Time 4193   PT Stop Time 1628   PT Time Calculation (min) 57 min   Activity Tolerance Patient limited by pain      Past Medical History  Diagnosis Date  . Seasonal allergies   . Fibroids   . Obesity     Past Surgical History  Procedure Laterality Date  . Myomectomy    . Abdominal hysterectomy    . Cesarean section      There were no vitals filed for this visit.  Visit Diagnosis:  Lumbar radiculopathy  Weakness of right lower extremity  Stiffness due to immobility  Radicular pain of right lower back  Decreased strength, endurance, and mobility      Subjective Assessment - 01/24/15 1535    Subjective Pt reports she fell down her steps 2 days ago.  Lower Rt leg felt tweaked.  Pt continues with disrupted sleep (30 min at a time).    Currently in Pain? Yes   Pain Score 5    Pain Location Back   Pain Orientation Right   Pain Descriptors / Indicators Dull;Aching;Constant   Pain Radiating Towards (localized) but still has numb Rt great toe and next toe.             [facility] PT Assessment - 01/24/15 0001    Assessment   Medical Diagnosis Rt lumbar radiculopathy   Onset Date/Surgical Date 01/12/15   Hand Dominance Right   Next MD Visit 02/11/15           [facility] Adult PT Treatment/Exercise - 01/24/15 0001    Lumbar Exercises: Stretches   Passive Hamstring Stretch 3 reps;30 seconds   Passive Hamstring Stretch Limitations supine oppisite knee bent w/ strap   Piriformis Stretch 1 rep;60 seconds  each  leg   Lumbar Exercises: Aerobic   Stationary Bike NuStep L3: 6 min    Lumbar Exercises: Supine   Ab Set 10 reps;5 seconds   AB Set Limitations required VC/tactile cues    Clam 5 reps;1 second   Lumbar Exercises: Prone   Other Prone Lumbar Exercises Prone press ups x 10 reps   Other Prone Lumbar Exercises prone pelvic press 5 sec hold x 5 reps   Lumbar Exercises: Quadruped   Madcat/Old Horse 5 reps   Electrical Stimulation   Electrical Stimulation Action IFC   Electrical Stimulation Parameters to tolerance x 15 min    Electrical Stimulation Goals Pain   Manual Therapy   Manual Therapy Joint mobilization;Soft tissue mobilization   Joint Mobilization L5/L4 grade II PA and Rt lat mobs  provided by supervising PT, Celyn Holt.    Soft tissue mobilization Rt/ Lt glute and piriformis with passive ER ROM in hip            PT Education - 01/24/15 1613    Education provided Yes   Education Details Pt to continue current HEP; engage core with transition movements and use log roll for supine to/from sit.    Person(s) Educated Patient   Methods Explanation;Demonstration;Handout  Comprehension Returned demonstration;Verbalized understanding          PT Short Term Goals - 01/16/15 1215    PT SHORT TERM GOAL #1   Title Patient verbalizes/demonstrates sfe spine positions/movements 02/13/15   Time 4   Period Weeks   Status New   PT SHORT TERM GOAL #2   Title Increase Rt hamstring flexibility to 90 degrees without radicular symptoms 02/13/15   Time 4   Period Weeks   Status New   PT SHORT TERM GOAL #3   Title Patient to tolerate 45 min sitting; 15 min standing; 15 min walking 02/13/15   Time 4   Period Weeks   Status New   PT SHORT TERM GOAL #4   Title Centralizatioin of Rt LE radicular symptoms by patient report  02/13/15   Time 4   Period Weeks   Status New           PT Long Term Goals - 01/16/15 1219    PT LONG TERM GOAL #1   Title Patient I in HEP for discharge  03/13/15   Time 8   Period Weeks   PT LONG TERM GOAL #2   Title 5-/5 to 5/5 strength Rt LE 03/13/15   Time 8   Period Weeks   Status New   PT LONG TERM GOAL #3   Title Pain decreased to 0/10 to 2/10 90% of day 03/13/15   Time 8   Period Weeks   Status New   PT LONG TERM GOAL #4   Title Decrease FOTO to </= 44% limitatiion 03/13/15   Time 8   Period Weeks   Status New               Plan - 01/24/15 1809    Clinical Impression Statement Pt presents with slight improvement of LBP.  Pt tolerated all new exercises without increase in back pain.  Pt required mulitple cues for engagement of mulitfidus and trans abd. Pt reported reduction of symptoms after manual and estim at end of session. Progressing towards goals.    Pt will benefit from skilled therapeutic intervention in order to improve on the following deficits Pain;Decreased range of motion;Decreased strength;Abnormal gait;Improper body mechanics;Decreased activity tolerance   Rehab Potential Good   PT Frequency 2x / week   PT Duration 8 weeks   PT Treatment/Interventions Patient/family education;ADLs/Self Care Home Management;Therapeutic exercise;Therapeutic activities;Manual techniques;Cryotherapy;Electrical Stimulation;Moist Heat;Ultrasound;Traction;Neuromuscular re-education;Dry needling   PT Next Visit Plan review HEP; continue spine education; add standing trunk extension; progress with core stabilization   Consulted and Agree with Plan of Care Patient        Problem List Patient Active Problem List   Diagnosis Date Noted  . Right lumbar radiculopathy 01/14/2015  . Primary osteoarthritis of right knee 01/14/2015  . Obesity      Kerin Perna, PTA 01/24/2015 6:13 PM   [facility] [place], [place], [address] Phone: [phone]   Fax:  [phone]

## 2015-01-29 ENCOUNTER — Ambulatory Visit (INDEPENDENT_AMBULATORY_CARE_PROVIDER_SITE_OTHER): Payer: BC Managed Care – PPO

## 2015-01-29 DIAGNOSIS — M5126 Other intervertebral disc displacement, lumbar region: Secondary | ICD-10-CM | POA: Diagnosis not present

## 2015-01-29 DIAGNOSIS — M5416 Radiculopathy, lumbar region: Secondary | ICD-10-CM

## 2015-01-31 ENCOUNTER — Ambulatory Visit (INDEPENDENT_AMBULATORY_CARE_PROVIDER_SITE_OTHER): Payer: BC Managed Care – PPO | Admitting: Rehabilitative and Restorative Service Providers"

## 2015-01-31 ENCOUNTER — Encounter: Payer: Self-pay | Admitting: Sports Medicine

## 2015-01-31 ENCOUNTER — Ambulatory Visit (INDEPENDENT_AMBULATORY_CARE_PROVIDER_SITE_OTHER): Payer: BC Managed Care – PPO | Admitting: Sports Medicine

## 2015-01-31 DIAGNOSIS — M5416 Radiculopathy, lumbar region: Secondary | ICD-10-CM | POA: Diagnosis not present

## 2015-01-31 DIAGNOSIS — IMO0001 Reserved for inherently not codable concepts without codable children: Secondary | ICD-10-CM

## 2015-01-31 DIAGNOSIS — R531 Weakness: Secondary | ICD-10-CM

## 2015-01-31 DIAGNOSIS — Z7409 Other reduced mobility: Secondary | ICD-10-CM

## 2015-01-31 DIAGNOSIS — M623 Immobility syndrome (paraplegic): Secondary | ICD-10-CM

## 2015-01-31 DIAGNOSIS — R6889 Other general symptoms and signs: Secondary | ICD-10-CM

## 2015-01-31 DIAGNOSIS — M2560 Stiffness of unspecified joint, not elsewhere classified: Secondary | ICD-10-CM

## 2015-01-31 DIAGNOSIS — R29898 Other symptoms and signs involving the musculoskeletal system: Secondary | ICD-10-CM | POA: Diagnosis not present

## 2015-01-31 MED ORDER — DIAZEPAM 5 MG PO TABS: ORAL_TABLET | ORAL | Status: DC

## 2015-01-31 NOTE — Progress Notes (Signed)
  Subjective:    CC: Follow-up MRI  HPI: This is a very pleasant 44 year old female with right-sided lumbar radiculopathy, clinically she describes this in an L4 distribution, MRI results will be dictated below, she has been doing physical therapy for 2 weeks, and has had only a minimal improvement. Her pain is intractable and she desired to proceed more quickly with intervention.  Past medical history, Surgical history, Family history not pertinant except as noted below, Social history, Allergies, and medications have been entered into the medical record, reviewed, and no changes needed.   Review of Systems: No fevers, chills, night sweats, weight loss, chest pain, or shortness of breath.   Objective:    General: Well Developed, well nourished, and in no acute distress.  Neuro: Alert and oriented x3, extra-ocular muscles intact, sensation grossly intact.  HEENT: Normocephalic, atraumatic, pupils equal round reactive to light, neck supple, no masses, no lymphadenopathy, thyroid nonpalpable.  Skin: Warm and dry, no rashes. Cardiac: Regular rate and rhythm, no murmurs rubs or gallops, no lower extremity edema.  Respiratory: Clear to auscultation bilaterally. Not using accessory muscles, speaking in full sentences.  MR shows multiple disc protrusions, moderate at the L4-L5 level with a sided foraminal component and worse at the L5-S1 level with a soft disc protrusion into the foramen clearly contacting the L5 nerve root  Impression and Recommendations:   I spent 40 minutes with this patient, greater than 50% was face-to-face time counseling regarding the above diagnoses

## 2015-01-31 NOTE — Therapy (Signed)
[place], [place], [address] Phone: [phone]   Fax:  [phone]  Physical Therapy Treatment  Patient Details  Name: Vickie Castillo MRN: [id number] Date of Birth: 1970/08/07 Referring Provider:  Silverio Decamp,*  Encounter Date: 01/31/2015      PT End of Session - 01/31/15 1533    Visit Number 4   Number of Visits 16   Date for PT Re-Evaluation 03/13/15   PT Start Time 0240   PT Stop Time 1534   PT Time Calculation (min) 51 min   Activity Tolerance Patient limited by pain      Past Medical History  Diagnosis Date  . Seasonal allergies   . Fibroids   . Obesity     Past Surgical History  Procedure Laterality Date  . Myomectomy    . Abdominal hysterectomy    . Cesarean section      There were no vitals filed for this visit.  Visit Diagnosis:  Lumbar radiculopathy  Weakness of right lower extremity  Stiffness due to immobility  Radicular pain of right lower back  Decreased strength, endurance, and mobility      Subjective Assessment - 01/31/15 1448    Subjective Pain contrinues with little change. MRI shows buldging discs at L5/S1; L5/L4; L4/L5. L5/Si is the worst with disc material on nerve. She is to be scheduled for two injections ASAP. Still working.    Currently in Pain? Yes   Pain Score 4    Pain Location Back   Pain Orientation Right   Pain Descriptors / Indicators Aching   Pain Radiating Towards into Rt glut and leg   Pain Onset 1 to 4 weeks ago   Pain Frequency Constant            [facility] PT Assessment - 01/31/15 0001    Flexibility   Hamstrings 72 deg Lt                     [facility] Adult PT Treatment/Exercise - 01/31/15 0001    Lumbar Exercises: Stretches   Passive Hamstring Stretch 3 reps;30 seconds   Passive Hamstring Stretch Limitations supine oppisite knee bent w/ strap   Prone on Elbows Stretch Limitations prone lying flat 2-5  min   Press Ups Limitations partial press up 1-2 sec pause 10 reps   Piriformis Stretch 3 reps;20 seconds   Lumbar Exercises: Aerobic   Stationary Bike NuStep L3: 6 min    Lumbar Exercises: Supine   Ab Set 10 reps  10 sec hold verbal and tactile cues for correct technique   Glut Set 10 reps  10 sec hold prone   Lumbar Exercises: Quadruped   Madcat/Old Horse 10 reps   Cryotherapy   Number Minutes Cryotherapy 18 Minutes   Cryotherapy Location Lumbar Spine;Hip   Type of Cryotherapy Ice pack   Electrical Stimulation   Electrical Stimulation Location L4/5 bilat/Rt piriformis/hip abductors   Electrical Stimulation Action IFC   Electrical Stimulation Parameters to tolerance   Electrical Stimulation Goals Pain   Manual Therapy   Manual Therapy Joint mobilization;Soft tissue mobilization   Joint Mobilization L5/L4 grade II PA and Rt PA mobs   Soft tissue mobilization Rt/ Lt glute and piriformis with passive ER ROM in hip    Myofascial Release Rt hip/buttock  deep pressure                PT Education - 01/31/15 1533  Education provided Yes   Education Details spine care; HEP   Person(s) Educated Patient   Methods Explanation;Demonstration;Tactile cues;Verbal cues;Handout   Comprehension Verbalized understanding;Returned demonstration;Verbal cues required;Tactile cues required          PT Short Term Goals - 01/31/15 1536    PT SHORT TERM GOAL #1   Title Patient verbalizes/demonstrates safe spine positions/movements 02/13/15   Time 4   Period Weeks   Status On-going   PT SHORT TERM GOAL #2   Title Increase Rt hamstring flexibility to 90 degrees without radicular symptoms 02/13/15   Baseline 70 deg   Time 4   Period Weeks   Status On-going   PT SHORT TERM GOAL #3   Title Patient to tolerate 45 min sitting; 15 min standing; 15 min walking 02/13/15   Baseline improving functional activity tolerance   Time 4   Period Weeks   Status On-going   PT SHORT TERM GOAL #4    Title Centralizationn of Rt LE radicular symptoms by patient report  02/13/15   Time 4   Period Weeks   Status On-going           PT Long Term Goals - 01/16/15 1219    PT LONG TERM GOAL #1   Title Patient I in HEP for discharge 03/13/15   Time 8   Period Weeks   PT LONG TERM GOAL #2   Title 5-/5 to 5/5 strength Rt LE 03/13/15   Time 8   Period Weeks   Status New   PT LONG TERM GOAL #3   Title Pain decreased to 0/10 to 2/10 90% of day 03/13/15   Time 8   Period Weeks   Status New   PT LONG TERM GOAL #4   Title Decrease FOTO to </= 44% limitatiion 03/13/15   Time 8   Period Weeks   Status New               Plan - 01/31/15 1534    Clinical Impression Statement Improving hamstring mobility and transfers. Less tightness to palpation and with spring testing; more tolerant of activities but has continued pain on a daily basis. to be scheduled for injections    Pt will benefit from skilled therapeutic intervention in order to improve on the following deficits Pain;Decreased range of motion;Decreased strength;Abnormal gait;Improper body mechanics;Decreased activity tolerance   Rehab Potential Good   PT Frequency 2x / week   PT Duration 8 weeks   PT Treatment/Interventions Patient/family education;ADLs/Self Care Home Management;Therapeutic exercise;Therapeutic activities;Manual techniques;Cryotherapy;Electrical Stimulation;Moist Heat;Ultrasound;Traction;Neuromuscular re-education;Dry needling   PT Next Visit Plan review HEP; continue spine education; add standing trunk extension; progress with core stabilization   PT Home Exercise Plan spine care; erogonomic positioinal changes; 3 part core; stretching; extension program   Consulted and Agree with Plan of Care Patient        Problem List Patient Active Problem List   Diagnosis Date Noted  . Right lumbar radiculopathy 01/14/2015  . Primary osteoarthritis of right knee 01/14/2015  . Obesity     Keymari Sato Nilda Simmer PT,  MPH 01/31/2015, 6:23 PM  [facility] [place], [place], [address] Phone: [phone]   Fax:  [phone]

## 2015-01-31 NOTE — Patient Instructions (Signed)
Piriformis Stretch   Lying on back, pull right knee toward opposite shoulder. Hold __20-30__ seconds. Repeat __3__ times. Do __2__ sessions per day.  Gluteal Sets   Tighten buttocks while pressing pelvis to floor. Hold ___10_ seconds. Repeat _10___ times per set. Do _1-2___ sets per session. Do __2-3__ sessions per day.  http://orth.exer.us/105   Copyright  V[employer]. All rights reserved.

## 2015-01-31 NOTE — Assessment & Plan Note (Signed)
MRI does show multilevel disc protrusions. The worst are on the right at the L4-L5 level as well as the L5-S1 level, the exiting L5 nerve root does appear compressed by soft disc fragment, there is also minimal foraminal stenosis at the L4-L5 level. Symptoms are predominantly referable to the L4 nerve root, but considering the degree of compression of the L5 nerve root I am going to request a right L4-L5 transforaminal epidural and a right L5-S1 transforaminal epidural. Next line return to see me one month after the injection to evaluate response.

## 2015-02-01 ENCOUNTER — Other Ambulatory Visit: Payer: Self-pay | Admitting: Sports Medicine

## 2015-02-01 DIAGNOSIS — M5416 Radiculopathy, lumbar region: Secondary | ICD-10-CM

## 2015-02-04 ENCOUNTER — Ambulatory Visit (INDEPENDENT_AMBULATORY_CARE_PROVIDER_SITE_OTHER): Payer: BC Managed Care – PPO | Admitting: Physical Therapy

## 2015-02-04 DIAGNOSIS — M623 Immobility syndrome (paraplegic): Secondary | ICD-10-CM

## 2015-02-04 DIAGNOSIS — M2560 Stiffness of unspecified joint, not elsewhere classified: Secondary | ICD-10-CM

## 2015-02-04 DIAGNOSIS — R29898 Other symptoms and signs involving the musculoskeletal system: Secondary | ICD-10-CM

## 2015-02-04 DIAGNOSIS — R531 Weakness: Secondary | ICD-10-CM

## 2015-02-04 DIAGNOSIS — Z7409 Other reduced mobility: Secondary | ICD-10-CM | POA: Diagnosis not present

## 2015-02-04 DIAGNOSIS — M5416 Radiculopathy, lumbar region: Secondary | ICD-10-CM

## 2015-02-04 DIAGNOSIS — IMO0001 Reserved for inherently not codable concepts without codable children: Secondary | ICD-10-CM

## 2015-02-04 NOTE — Therapy (Signed)
[place], [place], [address] Phone: [phone]   Fax:  [phone]  Physical Therapy Treatment  Patient Details  Name: Vickie Castillo MRN: [id number] Date of Birth: 1971/05/06 Referring Provider:  Silverio Decamp,*  Encounter Date: 02/04/2015      PT End of Session - 02/04/15 1604    Visit Number 5   Number of Visits 16   Date for PT Re-Evaluation 03/13/15   PT Start Time 1603   PT Stop Time 1650   PT Time Calculation (min) 47 min   Activity Tolerance Patient limited by pain      Past Medical History  Diagnosis Date  . Seasonal allergies   . Fibroids   . Obesity     Past Surgical History  Procedure Laterality Date  . Myomectomy    . Abdominal hysterectomy    . Cesarean section      There were no vitals filed for this visit.  Visit Diagnosis:  Lumbar radiculopathy  Weakness of right lower extremity  Stiffness due to immobility  Radicular pain of right lower back  Decreased strength, endurance, and mobility      Subjective Assessment - 02/04/15 1604    Subjective Back is worse today due to working long day today.  Pt reports her leg pain is worst at night.  Pt is scheduled for epidural in 2 days.    Currently in Pain? Yes   Pain Score 6    Pain Location Back   Pain Orientation Right  and middle of tail bone   Pain Descriptors / Indicators Spasm;Aching;Dull   Pain Radiating Towards no radiating pain, but Rt great toe numb.    Aggravating Factors  prolonged standing, sitting, bending, lifting   Pain Relieving Factors lying down, ice             [facility] PT Assessment - 02/04/15 0001    Assessment   Medical Diagnosis Rt lumbar radiculopathy   Onset Date/Surgical Date 01/12/15   Hand Dominance Right   Next MD Visit needs to reschedule.             [facility] Adult PT Treatment/Exercise - 02/04/15 0001    Lumbar Exercises: Stretches   Passive Hamstring Stretch  3 reps;30 seconds   Passive Hamstring Stretch Limitations supine oppisite knee bent w/ strap   Prone on Elbows Stretch Limitations prone lying flat 2 min (stopped due to spasm in LB)   Press Ups Limitations partial press up 1-2 sec pause 10 reps   Piriformis Stretch 1 rep;60 seconds   Lumbar Exercises: Aerobic   Stationary Bike NuStep L4: 20mn    Lumbar Exercises: Supine   Ab Set 10 reps  10 sec hold verbal and tactile cues for correct technique   Clam 10 reps  each leg; with ab set   Heel Slides 10 reps  x 10 each leg with ab set   Bent Knee Raise 10 reps  each leg with abdominal set   Cryotherapy   Number Minutes Cryotherapy 15 Minutes   Cryotherapy Location Lumbar Spine   Type of Cryotherapy Ice pack   Electrical Stimulation   Electrical Stimulation Location L4/5 bilat/Rt piriformis/hip abductors   Electrical Stimulation Action IFC   Electrical Stimulation Parameters to tolerance x 15 min    Electrical Stimulation Goals Pain                PT Education - 02/04/15 1640    Education provided  Yes   Education Details HEP - added to trans abd.    Person(s) Educated Patient   Methods Handout;Explanation;Demonstration   Comprehension Verbalized understanding;Returned demonstration          PT Short Term Goals - 01/31/15 1536    PT SHORT TERM GOAL #1   Title Patient verbalizes/demonstrates safe spine positions/movements 02/13/15   Time 4   Period Weeks   Status On-going   PT SHORT TERM GOAL #2   Title Increase Rt hamstring flexibility to 90 degrees without radicular symptoms 02/13/15   Baseline 70 deg   Time 4   Period Weeks   Status On-going   PT SHORT TERM GOAL #3   Title Patient to tolerate 45 min sitting; 15 min standing; 15 min walking 02/13/15   Baseline improving functional activity tolerance   Time 4   Period Weeks   Status On-going   PT SHORT TERM GOAL #4   Title Centralizationn of Rt LE radicular symptoms by patient report  02/13/15   Time 4    Period Weeks   Status On-going           PT Long Term Goals - 01/16/15 1219    PT LONG TERM GOAL #1   Title Patient I in HEP for discharge 03/13/15   Time 8   Period Weeks   PT LONG TERM GOAL #2   Title 5-/5 to 5/5 strength Rt LE 03/13/15   Time 8   Period Weeks   Status New   PT LONG TERM GOAL #3   Title Pain decreased to 0/10 to 2/10 90% of day 03/13/15   Time 8   Period Weeks   Status New   PT LONG TERM GOAL #4   Title Decrease FOTO to </= 44% limitatiion 03/13/15   Time 8   Period Weeks   Status New               Plan - 02/04/15 1638    Clinical Impression Statement Pt presents with increased pain today compared to last visit.  Pt limited today by some spasming in LB with therapeutic exercise.  Pt reported decrease in pain with use of estim and cryotherapy at end of session.  No goals met yet.    Pt will benefit from skilled therapeutic intervention in order to improve on the following deficits Pain;Decreased range of motion;Decreased strength;Abnormal gait;Improper body mechanics;Decreased activity tolerance   Rehab Potential Good   PT Frequency 2x / week   PT Duration 8 weeks   PT Treatment/Interventions Patient/family education;ADLs/Self Care Home Management;Therapeutic exercise;Therapeutic activities;Manual techniques;Cryotherapy;Electrical Stimulation;Moist Heat;Ultrasound;Traction;Neuromuscular re-education;Dry needling   PT Next Visit Plan Continue progressive core stabilization and spine education .    Consulted and Agree with Plan of Care Patient        Problem List Patient Active Problem List   Diagnosis Date Noted  . Right lumbar radiculopathy 01/14/2015  . Primary osteoarthritis of right knee 01/14/2015  . Obesity     Kerin Perna, PTA 02/04/2015 4:42 PM  [facility] [place], [place], [address] Phone: [phone]   Fax:  [phone]

## 2015-02-04 NOTE — Patient Instructions (Signed)
  Transverse abdominal exercises:   Knee to Chest: Transverse Plane Stability  Tighten abdominals : Bring one knee up, then return. Be sure pelvis does not roll side to side. Keep pelvis still. Lift knee __10_ times each leg. Restabilize pelvis. Repeat with other leg. Do _1-2__ sets, _1__ times per day.    Hip External Rotation With Pillow: Transverse Plane Stability  Tighten abdominals: One knee bent, one leg straight, on pillow. Slowly roll bent knee out. Be sure pelvis does not rotate. Do _10__ times. Restabilize pelvis. Repeat with other leg. Do _1-2__ sets, _1__ times per day.  Heel Slide: 4-10 Inches - Transverse Plane Stability  Tighten abdominals  Slide heel 4 inches down. Be sure pelvis does not rotate. Do _10__ times. Restabilize pelvis. Repeat with other leg. Do __1_ sets, _1__ times per day.   [facility] [place], [place] [address]  [phone] (office) [phone] (fax)

## 2015-02-05 ENCOUNTER — Encounter: Payer: Self-pay | Admitting: Sports Medicine

## 2015-02-05 ENCOUNTER — Telehealth: Payer: Self-pay | Admitting: Sports Medicine

## 2015-02-05 ENCOUNTER — Telehealth: Payer: Self-pay

## 2015-02-05 NOTE — Telephone Encounter (Signed)
Patient has been informed that letter is ready for pickup. Rhonda Cunningham,CMA  

## 2015-02-05 NOTE — Telephone Encounter (Signed)
Letter in box. 

## 2015-02-05 NOTE — Telephone Encounter (Signed)
Pt called to see if you can write her a note out of work today because her back has been having spasms and she is in pain and  doesn't get her epidural until tomorrow 02/06/15.Marland Kitchen

## 2015-02-06 ENCOUNTER — Ambulatory Visit
Admission: RE | Admit: 2015-02-06 | Discharge: 2015-02-06 | Disposition: A | Discharge status: Home / Self Care | Payer: BC Managed Care – PPO | Source: Ambulatory Visit | Attending: Sports Medicine | Admitting: Sports Medicine

## 2015-02-06 ENCOUNTER — Encounter: Payer: BC Managed Care – PPO | Admitting: Physical Therapy

## 2015-02-06 DIAGNOSIS — M5416 Radiculopathy, lumbar region: Secondary | ICD-10-CM

## 2015-02-06 MED ORDER — METHYLPREDNISOLONE ACETATE 40 MG/ML INJ SUSP (RADIOLOG
120.0000 mg | Freq: Once | INTRAMUSCULAR | Status: AC
  Administered 2015-02-06: 120 mg via EPIDURAL

## 2015-02-06 MED ORDER — IOHEXOL 180 MG/ML  SOLN
1.0000 mL | Freq: Once | INTRAMUSCULAR | Status: DC | PRN
  Administered 2015-02-06: 1 mL via EPIDURAL

## 2015-02-06 NOTE — Discharge Instructions (Signed)

## 2015-02-11 ENCOUNTER — Ambulatory Visit: Payer: BC Managed Care – PPO | Admitting: Sports Medicine

## 2015-02-12 ENCOUNTER — Telehealth: Payer: Self-pay

## 2015-02-12 MED ORDER — MELOXICAM 15 MG PO TABS: ORAL_TABLET | ORAL | Status: DC

## 2015-02-12 NOTE — Telephone Encounter (Signed)
PATIENT REQUEST REFILL FOR MELOXICAM. #[phone] REFILLS SENT TO PHARMACY. Rhonda Cunningham,CMA

## 2015-02-13 ENCOUNTER — Encounter: Payer: Self-pay | Admitting: Rehabilitative and Restorative Service Providers"

## 2015-02-13 ENCOUNTER — Ambulatory Visit (INDEPENDENT_AMBULATORY_CARE_PROVIDER_SITE_OTHER): Payer: BC Managed Care – PPO | Admitting: Rehabilitative and Restorative Service Providers"

## 2015-02-13 DIAGNOSIS — Z7409 Other reduced mobility: Secondary | ICD-10-CM

## 2015-02-13 DIAGNOSIS — M5416 Radiculopathy, lumbar region: Secondary | ICD-10-CM | POA: Diagnosis not present

## 2015-02-13 DIAGNOSIS — R6889 Other general symptoms and signs: Secondary | ICD-10-CM

## 2015-02-13 DIAGNOSIS — M2560 Stiffness of unspecified joint, not elsewhere classified: Secondary | ICD-10-CM

## 2015-02-13 DIAGNOSIS — R531 Weakness: Secondary | ICD-10-CM

## 2015-02-13 DIAGNOSIS — M623 Immobility syndrome (paraplegic): Secondary | ICD-10-CM

## 2015-02-13 DIAGNOSIS — IMO0001 Reserved for inherently not codable concepts without codable children: Secondary | ICD-10-CM

## 2015-02-13 DIAGNOSIS — R29898 Other symptoms and signs involving the musculoskeletal system: Secondary | ICD-10-CM | POA: Diagnosis not present

## 2015-02-13 NOTE — Therapy (Signed)
[place], [place], [address] Phone: [phone]   Fax:  [phone]  Physical Therapy Treatment  Patient Details  Name: Vickie Castillo MRN: [id number] Date of Birth: 1970/07/19 Referring Evola Hollis:  Silverio Decamp,*  Encounter Date: 02/13/2015      PT End of Session - 02/13/15 1610    Visit Number 6   Number of Visits 16   Date for PT Re-Evaluation 03/13/15   PT Start Time 2355   PT Stop Time 1700   PT Time Calculation (min) 56 min   Activity Tolerance Patient tolerated treatment well;Patient limited by pain      Past Medical History  Diagnosis Date  . Seasonal allergies   . Fibroids   . Obesity     Past Surgical History  Procedure Laterality Date  . Myomectomy    . Abdominal hysterectomy    . Cesarean section      There were no vitals filed for this visit.  Visit Diagnosis:  Lumbar radiculopathy  Weakness of right lower extremity  Stiffness due to immobility  Radicular pain of right lower back  Decreased strength, endurance, and mobility      Subjective Assessment - 02/13/15 1606    Subjective Epidural last Wednesday. Really helped but feels like it is kinda wearing off now. Trying to take care of herself but it is hard with 4 kids and work.    Currently in Pain? Yes   Pain Score 1    Pain Location Back   Pain Orientation Right   Pain Descriptors / Indicators Dull   Pain Radiating Towards no radiating pain   Pain Onset 1 to 4 weeks ago   Pain Frequency Constant            [facility] PT Assessment - 02/13/15 0001    Flexibility   Hamstrings 80 deg Lt; 74 deg Rt    Palpation   Spinal mobility decreased pain with PA spring testing sacrum/L5/L4 and Rt lateral spring testing L5/L4   SI assessment  minimal sensitive Rt   Palpation comment muscular tightness and pain Rt QL/piriformis/hip abductors sacrum to greater trochanter. Pain in Rt buttocks with palpation  through Lt hip abductors/extensors                     [facility] Adult PT Treatment/Exercise - 02/13/15 0001    Lumbar Exercises: Stretches   Passive Hamstring Stretch 3 reps;30 seconds   Passive Hamstring Stretch Limitations supine oppisite knee bent w/ strap   Prone on Elbows Stretch Limitations prone lying flat 2 min (stopped due to spasm in LB)   Press Ups Limitations partial press up 1-2 sec pause 10 reps   ITB Stretch 3 reps;30 seconds   Piriformis Stretch 20 seconds  with PT assist   Lumbar Exercises: Aerobic   Stationary Bike NuStep L4: 58min    Lumbar Exercises: Standing   Other Standing Lumbar Exercises gastroc stretch 30 sec x2   Other Standing Lumbar Exercises soleus stretch 30 sec x2   Lumbar Exercises: Supine   Ab Set 10 reps  10 sec hold verbal and tactile cues for correct technique   Other Supine Lumbar Exercises marching supine 10 ea leg from hooklying position   Other Supine Lumbar Exercises dead bug arms and legs 10 each with core stabilization    Cryotherapy   Number Minutes Cryotherapy 15 Minutes   Cryotherapy Location Lumbar Spine;Hip   Type of Cryotherapy Ice pack  Acupuncturist Location L4/5 bilat to Rt hip   Electrical Stimulation Action IFC   Electrical Stimulation Parameters to tolerance   Electrical Stimulation Goals Pain   Manual Therapy   Manual Therapy Joint mobilization;Soft tissue mobilization   Joint Mobilization L5/L4 grade II CPA and Rt UPA mobs   Soft tissue mobilization Rt/ Lt glute and piriformis    Myofascial Release Rt hip/buttock                PT Education - 02/13/15 1632    Education provided Yes   Education Details Continue spine care education; exercise instruction    Person(s) Educated Patient   Methods Explanation;Demonstration;Tactile cues;Verbal cues;Handout   Comprehension Verbalized understanding;Returned demonstration;Verbal cues required;Tactile cues required           PT Short Term Goals - 01/31/15 1536    PT SHORT TERM GOAL #1   Title Patient verbalizes/demonstrates safe spine positions/movements 02/13/15   Time 4   Period Weeks   Status On-going   PT SHORT TERM GOAL #2   Title Increase Rt hamstring flexibility to 90 degrees without radicular symptoms 02/13/15   Baseline 70 deg   Time 4   Period Weeks   Status On-going   PT SHORT TERM GOAL #3   Title Patient to tolerate 45 min sitting; 15 min standing; 15 min walking 02/13/15   Baseline improving functional activity tolerance   Time 4   Period Weeks   Status On-going   PT SHORT TERM GOAL #4   Title Centralizationn of Rt LE radicular symptoms by patient report  02/13/15   Time 4   Period Weeks   Status On-going           PT Long Term Goals - 02/13/15 1656    PT LONG TERM GOAL #1   Title Patient I in HEP for discharge 03/13/15   Time 8   Period Weeks   Status On-going   PT LONG TERM GOAL #2   Title 5-/5 to 5/5 strength Rt LE 03/13/15   Time 8   Period Weeks   Status On-going   PT LONG TERM GOAL #3   Title Pain decreased to 0/10 to 2/10 90% of day 03/13/15   Time 8   Period Weeks   Status On-going   PT LONG TERM GOAL #4   Title Decrease FOTO to </= 44% limitatiion 03/13/15   Period Weeks   Status On-going               Plan - 02/13/15 1650    Clinical Impression Statement Improved pain Rt LE following ESI 02/06/15. Good improvement in pain and increased edurance for ADL's - still has pain in LB and to the Rt sacral and hip area. Needs continued work on lumbar stabilization avoiding trunk rotation - focus on core stabilization. Tightness behind Lt knee likely compensatory - adddressed with gastroc/soleus stretches, cont with hamstring and adding IT band stretches. Tolerated advanced core work without difficulty.    Pt will benefit from skilled therapeutic intervention in order to improve on the following deficits Pain;Decreased range of motion;Decreased  strength;Abnormal gait;Improper body mechanics;Decreased activity tolerance   Rehab Potential Good   PT Frequency 2x / week   PT Duration 8 weeks   PT Treatment/Interventions Patient/family education;ADLs/Self Care Home Management;Therapeutic exercise;Therapeutic activities;Manual techniques;Cryotherapy;Electrical Stimulation;Moist Heat;Ultrasound;Traction;Neuromuscular re-education;Dry needling   PT Next Visit Plan Continue progressive core stabilization and spine education .    PT Home Exercise Plan spine  care; 3 part core; stretching; extension program; stabilization    Consulted and Agree with Plan of Care Patient        Problem List Patient Active Problem List   Diagnosis Date Noted  . Right lumbar radiculopathy 01/14/2015  . Primary osteoarthritis of right knee 01/14/2015  . Obesity     Celyn Nilda Simmer PT, MPH 02/13/2015, 4:57 PM  [facility] [place], [place], [address] Phone: [phone]   Fax:  [phone]

## 2015-02-13 NOTE — Patient Instructions (Signed)
Achilles / Gastroc, Standing   Stand, right foot behind, heel on floor and turned slightly out, leg straight, forward leg bent. Move hips forward. Hold _30__ seconds. Repeat _3__ times per session. Do 2-3__ sessions per day.  Achilles / Soleus, Standing   Stand, right foot behind, heel on floor and turned slightly out. Lower hips and bend knees. Hold _30__ seconds. Repeat 3___ times per session. Do _2-3__ sessions per day.    Outer Hip Stretch: Reclined IT Band Stretch (Strap)   Strap around opposite foot, pull across only as far as possible with shoulders on mat. Hold for __30__ seconds. Repeat __3__ times each leg. 2-3 x/day.   Combination (Hook-Lying)   Tighten core and slowly raise left leg and lower right arm over head. Keep trunk rigid. Repeat _10___ times per set. Do _1-2___ sets per session. Do __1-2__ sessions per day.

## 2015-02-20 ENCOUNTER — Encounter: Payer: Self-pay | Admitting: Rehabilitative and Restorative Service Providers"

## 2015-02-20 ENCOUNTER — Ambulatory Visit (INDEPENDENT_AMBULATORY_CARE_PROVIDER_SITE_OTHER): Payer: BC Managed Care – PPO | Admitting: Rehabilitative and Restorative Service Providers"

## 2015-02-20 DIAGNOSIS — R29898 Other symptoms and signs involving the musculoskeletal system: Secondary | ICD-10-CM | POA: Diagnosis not present

## 2015-02-20 DIAGNOSIS — M2560 Stiffness of unspecified joint, not elsewhere classified: Secondary | ICD-10-CM

## 2015-02-20 DIAGNOSIS — M5416 Radiculopathy, lumbar region: Secondary | ICD-10-CM

## 2015-02-20 DIAGNOSIS — Z7409 Other reduced mobility: Secondary | ICD-10-CM | POA: Diagnosis not present

## 2015-02-20 DIAGNOSIS — IMO0001 Reserved for inherently not codable concepts without codable children: Secondary | ICD-10-CM

## 2015-02-20 DIAGNOSIS — R531 Weakness: Secondary | ICD-10-CM

## 2015-02-20 DIAGNOSIS — M623 Immobility syndrome (paraplegic): Secondary | ICD-10-CM | POA: Diagnosis not present

## 2015-02-20 NOTE — Therapy (Signed)
[place], [place], [address] Phone: [phone]   Fax:  [phone]  Physical Therapy Treatment  Patient Details  Name: Vickie Castillo MRN: [id number] Date of Birth: August 25, 1970 Referring Provider:  Silverio Decamp,*  Encounter Date: 02/20/2015      PT End of Session - 02/20/15 1644    Visit Number 7   Number of Visits 16   Date for PT Re-Evaluation 03/13/15   PT Start Time 2330   PT Stop Time 1657   PT Time Calculation (min) 51 min   Activity Tolerance Patient tolerated treatment well      Past Medical History  Diagnosis Date  . Seasonal allergies   . Fibroids   . Obesity     Past Surgical History  Procedure Laterality Date  . Myomectomy    . Abdominal hysterectomy    . Cesarean section      There were no vitals filed for this visit.  Visit Diagnosis:  Lumbar radiculopathy  Weakness of right lower extremity  Stiffness due to immobility  Radicular pain of right lower back  Decreased strength, endurance, and mobility      Subjective Assessment - 02/20/15 1602    Subjective Doing well - has not taken pain medication in 3 days. Had some pain Saturday but took it easy and pain subsided. Knee is hurting - scheduled for injection with Dr. Darene Lamer to,orrow - Lt knee- thinks the knee hurts worse from shifting weight to Lt when Rt hip and leg hurt. Has arthritis in the knee.    Pertinent History LBP with pain into the Rt LE intermittently over the past 6 months. Arthritis bilat knees   Currently in Pain? Yes   Pain Score 1    Pain Location Back   Pain Orientation Right   Pain Descriptors / Indicators Dull   Pain Radiating Towards no radiating pain - paiin in Rt buttock   Pain Onset More than a month ago                         [facility] Adult PT Treatment/Exercise - 02/20/15 0001    Lumbar Exercises: Stretches   Passive Hamstring Stretch 3 reps;30 seconds   Passive  Hamstring Stretch Limitations supine oppisite knee bent w/ strap   Prone on Elbows Stretch Limitations prone lying flat 2 min (stopped due to spasm in LB)   Press Ups Limitations partial press up 1-2 sec pause 10 reps   ITB Stretch 3 reps;30 seconds   Piriformis Stretch 20 seconds;3 reps  with PT assist   Piriformis Stretch Limitations with starp and PT assist varied positions 3 reps each side   Lumbar Exercises: Aerobic   Stationary Bike NuStep L3: 38min    Lumbar Exercises: Supine   Ab Set 10 reps  10 sec hold verbal and tactile cues for correct technique   Clam 10 reps   Other Supine Lumbar Exercises marching supine 10 ea leg from hooklying position   Other Supine Lumbar Exercises dead bug arms and legs 10 each with core stabilization    Cryotherapy   Number Minutes Cryotherapy 15 Minutes   Cryotherapy Location Forearm;Hip   Electrical Stimulation   Electrical Stimulation Location L4/5 bilat - Rt hip   Electrical Stimulation Action IFC   Electrical Stimulation Parameters to tolerance   Electrical Stimulation Goals Pain   Manual Therapy   Manual Therapy Joint mobilization;Soft tissue mobilization   Joint  Mobilization L5/L4 grade II CPA and Rt UPA mobs   Soft tissue mobilization Rt/ Lt glute and piriformis    Myofascial Release Rt hip/buttock                PT Education - 02/20/15 1623    Education provided Yes   Education Details Spine care; HEP   Person(s) Educated Patient   Methods Explanation;Demonstration;Tactile cues;Verbal cues   Comprehension Verbalized understanding;Returned demonstration;Verbal cues required;Tactile cues required          PT Short Term Goals - 02/20/15 1649    PT SHORT TERM GOAL #1   Title Patient verbalizes/demonstrates safe spine positions/movements 02/13/15   Baseline improving safe mobility and traisitional movements.   Time 4   Period Weeks   Status Partially Met   PT SHORT TERM GOAL #2   Title Increase Rt hamstring flexibility  to 90 degrees without radicular symptoms 02/13/15   Time 4   Period Weeks   Status On-going   PT SHORT TERM GOAL #3   Title Patient to tolerate 45 min sitting; 15 min standing; 15 min walking 02/13/15   Baseline improving functional activity tolerance   Time 4   Period Weeks   Status Achieved   PT SHORT TERM GOAL #4   Title Centralizationn of Rt LE radicular symptoms by patient report  02/13/15   Time 4   Period Weeks   Status Achieved           PT Long Term Goals - 02/20/15 1651    PT LONG TERM GOAL #1   Title Patient I in HEP for discharge 03/13/15   Time 8   Period Weeks   Status On-going   PT LONG TERM GOAL #2   Title 5-/5 to 5/5 strength Rt LE 03/13/15   Time 8   Period Weeks   Status On-going   PT LONG TERM GOAL #3   Title Pain decreased to 0/10 to 2/10 90% of day 03/13/15   Time 8   Period Weeks   Status On-going   PT LONG TERM GOAL #4   Title Decrease FOTO to </= 44% limitatiion 03/13/15   Time 8   Period Weeks   Status On-going               Plan - 02/20/15 1646    Clinical Impression Statement Cont improvement following ESI 02/06/15. Now with some increased Lt knee pain with injectioin schdeuled for tomorrow. Continued work on core stabilization and strengthening. Progressing toward stated goals of therapy. Requires cont  care to reach maximum rehab potential.    Pt will benefit from skilled therapeutic intervention in order to improve on the following deficits Pain;Decreased range of motion;Decreased strength;Abnormal gait;Improper body mechanics;Decreased activity tolerance   Rehab Potential Good   PT Frequency 2x / week   PT Duration 8 weeks   PT Treatment/Interventions Patient/family education;ADLs/Self Care Home Management;Therapeutic exercise;Therapeutic activities;Manual techniques;Cryotherapy;Electrical Stimulation;Moist Heat;Ultrasound;Traction;Neuromuscular re-education;Dry needling   PT Next Visit Plan Continue progressive core  stabilization as Lt knee pain allows and spine education    PT Home Exercise Plan spine care; 3 part core; stretching; extension program; stabilization    Consulted and Agree with Plan of Care Patient        Problem List Patient Active Problem List   Diagnosis Date Noted  . Right lumbar radiculopathy 01/14/2015  . Primary osteoarthritis of right knee 01/14/2015  . Obesity     Celyn Nilda Simmer PT, MPH 02/20/2015, 4:52 PM  [facility] [place], [place], [address] Phone: [phone]   Fax:  [phone]

## 2015-02-21 ENCOUNTER — Encounter: Payer: Self-pay | Admitting: Sports Medicine

## 2015-02-21 ENCOUNTER — Ambulatory Visit (INDEPENDENT_AMBULATORY_CARE_PROVIDER_SITE_OTHER): Payer: BC Managed Care – PPO | Admitting: Sports Medicine

## 2015-02-21 VITALS — BP 143/81 | HR 83 | Wt 241.0 lb

## 2015-02-21 DIAGNOSIS — M5416 Radiculopathy, lumbar region: Secondary | ICD-10-CM | POA: Diagnosis not present

## 2015-02-21 DIAGNOSIS — M170 Bilateral primary osteoarthritis of knee: Secondary | ICD-10-CM | POA: Diagnosis not present

## 2015-02-21 NOTE — Assessment & Plan Note (Signed)
Pain resolved after right-sided L4-L5 and L5-S1 transforaminal epidural's

## 2015-02-21 NOTE — Progress Notes (Signed)
  Subjective:    CC: Left knee pain  HPI: Right lumbar radiculopathy: Completely resolved with epidural.  Left knee osteoarthritis: Desires injection. Pain is moderate, persistent, localized at the joint lines without radiation. No mechanical symptoms.  Past medical history, Surgical history, Family history not pertinant except as noted below, Social history, Allergies, and medications have been entered into the medical record, reviewed, and no changes needed.   Review of Systems: No fevers, chills, night sweats, weight loss, chest pain, or shortness of breath.   Objective:    General: Well Developed, well nourished, and in no acute distress.  Neuro: Alert and oriented x3, extra-ocular muscles intact, sensation grossly intact.  HEENT: Normocephalic, atraumatic, pupils equal round reactive to light, neck supple, no masses, no lymphadenopathy, thyroid nonpalpable.  Skin: Warm and dry, no rashes. Cardiac: Regular rate and rhythm, no murmurs rubs or gallops, no lower extremity edema.  Respiratory: Clear to auscultation bilaterally. Not using accessory muscles, speaking in full sentences.  Procedure: Real-time Ultrasound Guided Injection of left knee Device: GE Logiq E  Verbal informed consent obtained.  Time-out conducted.  Noted no overlying erythema, induration, or other signs of local infection.  Skin prepped in a sterile fashion.  Local anesthesia: Topical Ethyl chloride.  With sterile technique and under real time ultrasound guidance:  2 mL Kenalog 40, 4 mL lidocaine injected easily Completed without difficulty  Pain immediately resolved suggesting accurate placement of the medication.  Advised to call if fevers/chills, erythema, induration, drainage, or persistent bleeding.  Images permanently stored and available for review in the ultrasound unit.  Impression: Technically successful ultrasound guided injection.  Impression and Recommendations:

## 2015-02-21 NOTE — Assessment & Plan Note (Signed)
Left knee injection as above. Return in one month

## 2015-02-28 ENCOUNTER — Ambulatory Visit (INDEPENDENT_AMBULATORY_CARE_PROVIDER_SITE_OTHER): Payer: BC Managed Care – PPO | Admitting: Physical Therapy

## 2015-02-28 DIAGNOSIS — M623 Immobility syndrome (paraplegic): Secondary | ICD-10-CM | POA: Diagnosis not present

## 2015-02-28 DIAGNOSIS — Z7409 Other reduced mobility: Secondary | ICD-10-CM | POA: Diagnosis not present

## 2015-02-28 DIAGNOSIS — M5416 Radiculopathy, lumbar region: Secondary | ICD-10-CM

## 2015-02-28 DIAGNOSIS — R29898 Other symptoms and signs involving the musculoskeletal system: Secondary | ICD-10-CM | POA: Diagnosis not present

## 2015-02-28 DIAGNOSIS — M2560 Stiffness of unspecified joint, not elsewhere classified: Secondary | ICD-10-CM

## 2015-02-28 DIAGNOSIS — R531 Weakness: Secondary | ICD-10-CM

## 2015-02-28 NOTE — Therapy (Signed)
[place], [place], [address] Phone: [phone]   Fax:  [phone]  Physical Therapy Treatment  Patient Details  Name: Vickie Castillo MRN: [id number] Date of Birth: March 13, 1971 Referring Provider:  Silverio Decamp,*  Encounter Date: 02/28/2015      PT End of Session - 02/28/15 1627    Visit Number 8   Number of Visits 16   Date for PT Re-Evaluation 03/13/15   PT Start Time 1626   PT Stop Time 1730   PT Time Calculation (min) 64 min   Activity Tolerance Patient tolerated treatment well      Past Medical History  Diagnosis Date  . Seasonal allergies   . Fibroids   . Obesity     Past Surgical History  Procedure Laterality Date  . Myomectomy    . Abdominal hysterectomy    . Cesarean section      There were no vitals filed for this visit.  Visit Diagnosis:  Lumbar radiculopathy  Weakness of right lower extremity  Stiffness due to immobility  Decreased strength, endurance, and mobility      Subjective Assessment - 02/28/15 1628    Subjective Pt reports she is taking pain medication as needed, mostly at night, for Lt knee and Rt hip.  Doesn't feel much relief from injection in knee.  Otherwise doing well.    Currently in Pain? Yes   Pain Score 1    Pain Location Back   Pain Orientation Right   Pain Descriptors / Indicators --  "twang"   Aggravating Factors  Prolonged standing   Pain Relieving Factors lying down, ice            [facility] PT Assessment - 02/28/15 0001    Assessment   Medical Diagnosis Rt lumbar radiculopathy   Onset Date/Surgical Date 01/12/15   Hand Dominance Right   Next MD Visit 03/07/15           [facility] Adult PT Treatment/Exercise - 02/28/15 0001    Exercises   Exercises Shoulder   Lumbar Exercises: Stretches   Passive Hamstring Stretch 3 reps;30 seconds   Passive Hamstring Stretch Limitations supine opposite knee bent w/ strap   Prone on  Elbows Stretch 60 seconds   Press Ups Limitations Press up with 3 sec hold x 8 reps.    Piriformis Stretch 30 seconds;2 reps   Piriformis Stretch Limitations with strap    Lumbar Exercises: Aerobic   Stationary Bike NuStep L3: 84mn    Shoulder Exercises: Stretch   Other Shoulder Stretches Doorway stretch; changed to unilateral due to increased LBP. 45 sec low, med, and high.    Modalities   Modalities Ultrasound;Electrical Stimulation   Cryotherapy   Number Minutes Cryotherapy 12 Minutes   Cryotherapy Location Lumbar Spine   Type of Cryotherapy Ice pack   Electrical Stimulation   Electrical Stimulation Location Rt SI/ piriformis   Electrical Stimulation Action combo    Electrical Stimulation Parameters to tolerance    Electrical Stimulation Goals Pain   Ultrasound   Ultrasound Location Rt SI/ lower lumbar paraspinals.    Ultrasound Parameters 100%, 1.0 mHz, 1.3 w/cm2 x 850m    Ultrasound Goals Pain   Manual Therapy   Manual Therapy Joint mobilization - performed by supervising PT, Celyn Holt   Joint Mobilization L5/L4, L5/S1 grade II-III CPA.   Soft tissue mobilization Deep pressure in to Rt SI joint/ glute, lumbar paraspinals and piriformis    Myofascial Release  Rt hip/buttock                  PT Short Term Goals - 02/20/15 1649    PT SHORT TERM GOAL #1   Title Patient verbalizes/demonstrates safe spine positions/movements 02/13/15   Baseline improving safe mobility and traisitional movements.   Time 4   Period Weeks   Status Partially Met   PT SHORT TERM GOAL #2   Title Increase Rt hamstring flexibility to 90 degrees without radicular symptoms 02/13/15   Time 4   Period Weeks   Status On-going   PT SHORT TERM GOAL #3   Title Patient to tolerate 45 min sitting; 15 min standing; 15 min walking 02/13/15   Baseline improving functional activity tolerance   Time 4   Period Weeks   Status Achieved   PT SHORT TERM GOAL #4   Title Centralizationn of Rt LE  radicular symptoms by patient report  02/13/15   Time 4   Period Weeks   Status Achieved           PT Long Term Goals - 02/20/15 1651    PT LONG TERM GOAL #1   Title Patient I in HEP for discharge 03/13/15   Time 8   Period Weeks   Status On-going   PT LONG TERM GOAL #2   Title 5-/5 to 5/5 strength Rt LE 03/13/15   Time 8   Period Weeks   Status On-going   PT LONG TERM GOAL #3   Title Pain decreased to 0/10 to 2/10 90% of day 03/13/15   Time 8   Period Weeks   Status On-going   PT LONG TERM GOAL #4   Title Decrease FOTO to </= 44% limitatiion 03/13/15   Time 8   Period Weeks   Status On-going               Plan - 02/28/15 1726    Clinical Impression Statement Pt tolerated exercises without increase in pain.  Pt improved mobility and decreased pain after manual therapy and combo (US/estim).  Pt progressing well towards established goals.    Pt will benefit from skilled therapeutic intervention in order to improve on the following deficits Pain;Decreased range of motion;Decreased strength;Abnormal gait;Improper body mechanics;Decreased activity tolerance   Rehab Potential Good   PT Frequency 2x / week   PT Duration 8 weeks   PT Treatment/Interventions Patient/family education;ADLs/Self Care Home Management;Therapeutic exercise;Therapeutic activities;Manual techniques;Cryotherapy;Electrical Stimulation;Moist Heat;Ultrasound;Traction;Neuromuscular re-education;Dry needling   PT Next Visit Plan Continue progressive core stabilization and spine education.  Assess response to combo. Measure LE strength and hamstring flexibility.   Consulted and Agree with Plan of Care Patient        Problem List Patient Active Problem List   Diagnosis Date Noted  . Right lumbar radiculopathy 01/14/2015  . Primary osteoarthritis of both knees 01/14/2015  . Obesity     Kerin Perna, PTA 02/28/2015 5:34 PM   Celyn P. Helene Kelp PT, MPH 03/01/2015 1:18 PM     [facility] [place], [place], [address] Phone: [phone]   Fax:  [phone]

## 2015-03-01 ENCOUNTER — Other Ambulatory Visit: Payer: Self-pay | Admitting: Sports Medicine

## 2015-03-06 ENCOUNTER — Encounter: Payer: BC Managed Care – PPO | Admitting: Rehabilitative and Restorative Service Providers"

## 2015-03-07 ENCOUNTER — Ambulatory Visit: Payer: BC Managed Care – PPO | Admitting: Sports Medicine

## 2015-03-13 ENCOUNTER — Ambulatory Visit: Payer: BC Managed Care – PPO | Admitting: Sports Medicine

## 2015-03-14 ENCOUNTER — Encounter: Payer: BC Managed Care – PPO | Admitting: Physical Therapy

## 2015-03-18 ENCOUNTER — Ambulatory Visit (INDEPENDENT_AMBULATORY_CARE_PROVIDER_SITE_OTHER): Payer: BC Managed Care – PPO | Admitting: Sports Medicine

## 2015-03-18 ENCOUNTER — Ambulatory Visit (INDEPENDENT_AMBULATORY_CARE_PROVIDER_SITE_OTHER): Payer: BC Managed Care – PPO | Admitting: Physical Therapy

## 2015-03-18 ENCOUNTER — Encounter: Payer: Self-pay | Admitting: Sports Medicine

## 2015-03-18 VITALS — BP 157/86 | HR 78 | Ht 63.5 in | Wt 245.0 lb

## 2015-03-18 DIAGNOSIS — M5416 Radiculopathy, lumbar region: Secondary | ICD-10-CM

## 2015-03-18 DIAGNOSIS — M623 Immobility syndrome (paraplegic): Secondary | ICD-10-CM | POA: Diagnosis not present

## 2015-03-18 DIAGNOSIS — IMO0001 Reserved for inherently not codable concepts without codable children: Secondary | ICD-10-CM

## 2015-03-18 DIAGNOSIS — E669 Obesity, unspecified: Secondary | ICD-10-CM | POA: Diagnosis not present

## 2015-03-18 DIAGNOSIS — R29898 Other symptoms and signs involving the musculoskeletal system: Secondary | ICD-10-CM | POA: Diagnosis not present

## 2015-03-18 DIAGNOSIS — R531 Weakness: Secondary | ICD-10-CM

## 2015-03-18 DIAGNOSIS — M170 Bilateral primary osteoarthritis of knee: Secondary | ICD-10-CM | POA: Diagnosis not present

## 2015-03-18 DIAGNOSIS — M2560 Stiffness of unspecified joint, not elsewhere classified: Secondary | ICD-10-CM

## 2015-03-18 DIAGNOSIS — R6889 Other general symptoms and signs: Secondary | ICD-10-CM

## 2015-03-18 DIAGNOSIS — Z7409 Other reduced mobility: Secondary | ICD-10-CM | POA: Diagnosis not present

## 2015-03-18 MED ORDER — ONDANSETRON 8 MG PO TBDP: 8.0000 mg | ORAL_TABLET | Freq: Three times a day (TID) | ORAL | Status: DC | PRN

## 2015-03-18 NOTE — Therapy (Signed)
[place], [place], [address] Phone: [phone]   Fax:  [phone]  Physical Therapy Treatment  Patient Details  Name: Vickie Castillo MRN: [id number] Date of Birth: April 17, 1971 No Data Recorded  Encounter Date: 03/18/2015      PT End of Session - 03/18/15 1522    Visit Number 9   Number of Visits 16   Date for PT Re-Evaluation 03/13/15   PT Start Time 1520   PT Stop Time 1625   PT Time Calculation (min) 65 min   Activity Tolerance Patient limited by pain      Past Medical History  Diagnosis Date  . Seasonal allergies   . Fibroids   . Obesity     Past Surgical History  Procedure Laterality Date  . Myomectomy    . Abdominal hysterectomy    . Cesarean section      There were no vitals filed for this visit.  Visit Diagnosis:  Lumbar radiculopathy  Weakness of right lower extremity  Stiffness due to immobility  Decreased strength, endurance, and mobility  Radicular pain of right lower back      Subjective Assessment - 03/18/15 1527    Subjective Pt returns from visit to [place] for viewing college for son. Pt had MD appointmt prior to therapy; relays message that she may have torn meniscus in Lt knee. Pt presents with antalgic gait.  Upcoming MRI for knee.  "My back is feeling pretty good"     Currently in Pain? Yes   Pain Score 1    Pain Location Back   Pain Orientation Right   Pain Descriptors / Indicators Dull   Aggravating Factors  bad posture, when not doing exercises    Pain Relieving Factors lying down, ice    Multiple Pain Sites Yes   Pain Score 8   Pain Location Knee   Pain Orientation Left   Pain Descriptors / Indicators Sharp;Radiating   Aggravating Factors  walking, stairs   Pain Relieving Factors ???            [facility] PT Assessment - 03/18/15 0001    Assessment   Medical Diagnosis Rt lumbar radiculopathy   Onset Date/Surgical Date 01/12/15   Hand  Dominance Right   Next MD Visit after knee MRI           [facility] Adult PT Treatment/Exercise - 03/18/15 0001    Bed Mobility   Bed Mobility --   Ambulation/Gait   Gait Comments Pt was briefly instructed in crutch training with upright posture to decrease pain in Lt knee.    Exercises   Exercises Lumbar   Lumbar Exercises: Stretches   Passive Hamstring Stretch 3 reps;30 seconds   Passive Hamstring Stretch Limitations supine opposite knee bent w/ strap   Press Ups Limitations Press up with 3 sec hold x 8 reps.    Piriformis Stretch 30 seconds;2 reps  modified due to knee injury   Lumbar Exercises: Aerobic   Stationary Bike NuStep L2: 31mn (to tolerance)    Lumbar Exercises: Supine   Heel Slides 15 reps  RLE only, with ab set   Other Supine Lumbar Exercises marching supine 10 ea leg from hooklying position, 2 sets   Lumbar Exercises: Sidelying   Clam 10 reps  2 sets   Lumbar Exercises: Prone   Other Prone Lumbar Exercises prone pelvic press 5 sec hold x 10 reps; with knee flexion (to tolerance) x 10 reps each leg  Cryotherapy   Number Minutes Cryotherapy 15 Minutes   Cryotherapy Location Lumbar Spine;Knee   Type of Cryotherapy Ice pack   Electrical Stimulation   Electrical Stimulation Location Rt SI/ piriformis   Electrical Stimulation Action IFC   Electrical Stimulation Parameters to tolerance    Electrical Stimulation Goals Pain   Manual Therapy   Manual therapy comments Assessed, no tenderness in hips with deep pressure to glutes/ deep rotators; minor tenderness at Lt SI and bilat LB paraspinals.                 PT Education - 03/18/15 1625    Education provided Yes   Education Details HEP- added pelvic presse.    Person(s) Educated Patient   Methods Explanation;Handout;Demonstration   Comprehension Returned demonstration;Verbalized understanding          PT Short Term Goals - 02/20/15 1649    PT SHORT TERM GOAL #1   Title Patient  verbalizes/demonstrates safe spine positions/movements 02/13/15   Baseline improving safe mobility and traisitional movements.   Time 4   Period Weeks   Status Partially Met   PT SHORT TERM GOAL #2   Title Increase Rt hamstring flexibility to 90 degrees without radicular symptoms 02/13/15   Time 4   Period Weeks   Status On-going   PT SHORT TERM GOAL #3   Title Patient to tolerate 45 min sitting; 15 min standing; 15 min walking 02/13/15   Baseline improving functional activity tolerance   Time 4   Period Weeks   Status Achieved   PT SHORT TERM GOAL #4   Title Centralizationn of Rt LE radicular symptoms by patient report  02/13/15   Time 4   Period Weeks   Status Achieved           PT Long Term Goals - 03/18/15 1621    PT LONG TERM GOAL #1   Title Patient I in HEP for discharge 03/13/15   Time 8   Period Weeks   Status On-going   PT LONG TERM GOAL #2   Title 5-/5 to 5/5 strength Rt LE 03/13/15   Time 8   Period Weeks   Status On-going   PT LONG TERM GOAL #3   Title Pain decreased to 0/10 to 2/10 90% of day 03/13/15   Time 8   Period Weeks   Status Achieved   PT LONG TERM GOAL #4   Title Decrease FOTO to </= 44% limitatiion 03/13/15   Time 8   Period Weeks   Status On-going               Plan - 03/18/15 1620    Clinical Impression Statement Pt required some tactile/ VC for pelvic press exercises.  Pt slightly limited in exercises due to increased pain in Lt knee with motion of that leg.  Pt has met LTG #3, progressing well towards remaining goals.     Pt will benefit from skilled therapeutic intervention in order to improve on the following deficits Pain;Decreased range of motion;Decreased strength;Abnormal gait;Improper body mechanics;Decreased activity tolerance   Rehab Potential Good   PT Frequency 2x / week   PT Duration 8 weeks   PT Treatment/Interventions Patient/family education;ADLs/Self Care Home Management;Therapeutic exercise;Therapeutic  activities;Manual techniques;Cryotherapy;Electrical Stimulation;Moist Heat;Ultrasound;Traction;Neuromuscular re-education;Dry needling   PT Next Visit Plan Continue progressive core stabilization and spine education.     Consulted and Agree with Plan of Care Patient        Problem List Patient Active Problem List  Diagnosis Date Noted  . Right lumbar radiculopathy 01/14/2015  . Primary osteoarthritis of both knees 01/14/2015  . Obesity    Kerin Perna, PTA 03/18/2015 4:40 PM  [facility] [place], [place], [address] Phone: [phone]   Fax:  [phone]  Name: Vickie Castillo MRN: [id number] Date of Birth: 06/29/1970

## 2015-03-18 NOTE — Progress Notes (Signed)
  Subjective:    CC: Follow-up  HPI: Left knee pain: Only a minimal response for a few days after the injection, continues to have severe medial joint line pain, locking, and in ability to fully flex the knee without severe pain.  Past medical history, Surgical history, Family history not pertinant except as noted below, Social history, Allergies, and medications have been entered into the medical record, reviewed, and no changes needed.   Review of Systems: No fevers, chills, night sweats, weight loss, chest pain, or shortness of breath.   Objective:    General: Well Developed, well nourished, and in no acute distress.  Neuro: Alert and oriented x3, extra-ocular muscles intact, sensation grossly intact.  HEENT: Normocephalic, atraumatic, pupils equal round reactive to light, neck supple, no masses, no lymphadenopathy, thyroid nonpalpable.  Skin: Warm and dry, no rashes. Cardiac: Regular rate and rhythm, no murmurs rubs or gallops, no lower extremity edema.  Respiratory: Clear to auscultation bilaterally. Not using accessory muscles, speaking in full sentences. Left Knee: Normal to inspection with no erythema or effusion or obvious bony abnormalities. Exquisitely tender at the medial joint line Range of motion limited to approximately 90 of flexion with severe pain beyond 90 Ligaments with solid consistent endpoints including ACL, PCL, LCL, MCL. McMurray's testing provides pain but no palpable click. Non painful patellar compression. Patellar and quadriceps tendons unremarkable. Hamstring and quadriceps strength is normal.  Impression and Recommendations:

## 2015-03-18 NOTE — Assessment & Plan Note (Signed)
Return to discuss weight loss medication

## 2015-03-18 NOTE — Patient Instructions (Signed)
Pelvic Press   Place hands under belly between navel and pubic bone, palms up. Feel pressure on hands. Increase pressure on hands by pressing pelvis down. This is NOT a pelvic tilt. Hold __5_ seconds. Relax. Repeat _10__ times.  KNEE: Flexion - Prone   Hold pelvic press. Bend knee. Raise heel toward buttocks. Repeat on opposite leg. Do not raise hips. _10__ reps per set, __1_ sets per day, _3__ days per week When this is mastered, pull both heels up at same time, x 10 reps.    Hip Extension (Prone)  Hold pelvic press  Lift left leg _3___ inches from floor, keeping knee locked. Repeat __10__ times per set. Do _1___ sets per session. Do _10___ sessions per day.   [facility] [address] [place], [place] [address]  [phone] (office) [phone] (fax)

## 2015-03-18 NOTE — Assessment & Plan Note (Addendum)
Persistent pain in the left knee medial joint line despite over one month of physician directed rehabilitation, as well as joint injection, suspect meniscal tear, MRI for surgical planning. If MRI is negative for meniscal tear or other intra-articular derangement we will proceed with viscous supplementation. Adding some nausea medication if she does eventually need a narcotic.

## 2015-03-20 ENCOUNTER — Telehealth: Payer: Self-pay

## 2015-03-20 DIAGNOSIS — M170 Bilateral primary osteoarthritis of knee: Secondary | ICD-10-CM

## 2015-03-20 MED ORDER — HYDROCODONE-ACETAMINOPHEN 5-325 MG PO TABS: 1.0000 | ORAL_TABLET | Freq: Three times a day (TID) | ORAL | Status: DC | PRN

## 2015-03-20 NOTE — Telephone Encounter (Signed)
Rx for vicodin waiting here, she should get on the triage schedule and have one of the triage nurses fit her for crutches when she is here to pick up the prescription.

## 2015-03-20 NOTE — Telephone Encounter (Signed)
Left message advising of recommendations.  

## 2015-03-20 NOTE — Telephone Encounter (Signed)
Vickie Castillo called and states she is having a lot of pain in her left knee. The tramadol is making her sick to her stomach. She wanted to know if using crutches would benefit her. If so could she get a prescription sent to the pharmacy.

## 2015-03-21 ENCOUNTER — Encounter: Payer: Self-pay | Admitting: Rehabilitative and Restorative Service Providers"

## 2015-03-21 ENCOUNTER — Ambulatory Visit (INDEPENDENT_AMBULATORY_CARE_PROVIDER_SITE_OTHER): Payer: BC Managed Care – PPO | Admitting: Rehabilitative and Restorative Service Providers"

## 2015-03-21 DIAGNOSIS — R29898 Other symptoms and signs involving the musculoskeletal system: Secondary | ICD-10-CM | POA: Diagnosis not present

## 2015-03-21 DIAGNOSIS — M2560 Stiffness of unspecified joint, not elsewhere classified: Secondary | ICD-10-CM

## 2015-03-21 DIAGNOSIS — Z7409 Other reduced mobility: Secondary | ICD-10-CM

## 2015-03-21 DIAGNOSIS — M623 Immobility syndrome (paraplegic): Secondary | ICD-10-CM

## 2015-03-21 DIAGNOSIS — IMO0001 Reserved for inherently not codable concepts without codable children: Secondary | ICD-10-CM

## 2015-03-21 DIAGNOSIS — M5416 Radiculopathy, lumbar region: Secondary | ICD-10-CM | POA: Diagnosis not present

## 2015-03-21 DIAGNOSIS — R531 Weakness: Secondary | ICD-10-CM

## 2015-03-21 NOTE — Therapy (Signed)
[place], [place], [address] Phone: [phone]   Fax:  [phone]  Physical Therapy Treatment  Patient Details  Name: Vickie Castillo MRN: [id number] Date of Birth: 17-Apr-1971 No Data Recorded  Encounter Date: 03/21/2015      PT End of Session - 03/21/15 1618    Visit Number 10   Number of Visits 16   Date for PT Re-Evaluation 03/13/15  add 4 weeks to POC from 03/13/15   PT Start Time 1616   PT Stop Time 1709   PT Time Calculation (min) 53 min   Activity Tolerance Patient limited by pain      Past Medical History  Diagnosis Date  . Seasonal allergies   . Fibroids   . Obesity     Past Surgical History  Procedure Laterality Date  . Myomectomy    . Abdominal hysterectomy    . Cesarean section      There were no vitals filed for this visit.  Visit Diagnosis:  Lumbar radiculopathy  Weakness of right lower extremity  Stiffness due to immobility  Decreased strength, endurance, and mobility  Radicular pain of right lower back      Subjective Assessment - 03/21/15 1619    Subjective MRI for knee is scheduled for tomorrow night. Cont knee and back pain. Back is hurting on the Lt side more than the Rt side.    Currently in Pain? Yes   Pain Score 2    Pain Location Back   Pain Orientation Right;Left   Pain Descriptors / Indicators Dull;Aching   Pain Type Chronic pain   Pain Radiating Towards into Rt buttock - on an intermittent basis    Pain Score 9   Pain Location Knee   Pain Orientation Left   Pain Descriptors / Indicators Aching;Sharp   Pain Type Acute pain   Pain Onset 1 to 4 weeks ago   Pain Frequency Constant   Aggravating Factors  walking, stairs                          [facility] Adult PT Treatment/Exercise - 03/21/15 0001    Exercises   Exercises Lumbar   Lumbar Exercises: Stretches   Passive Hamstring Stretch 3 reps;30 seconds   Passive Hamstring  Stretch Limitations supine opposite knee bent w/ strap   Piriformis Stretch 30 seconds;2 reps  modified due to knee injury   Lumbar Exercises: Aerobic   Stationary Bike unable to tolerate due to knee pain   Lumbar Exercises: Supine   Other Supine Lumbar Exercises marching supine 10 ea leg from hooklying position, 2 sets   Lumbar Exercises: Prone   Other Prone Lumbar Exercises prone pelvic press x10   Cryotherapy   Number Minutes Cryotherapy 15 Minutes   Cryotherapy Location Lumbar Spine;Knee   Type of Cryotherapy Ice pack   Electrical Stimulation   Electrical Stimulation Location Rt SI/ piriformis   Electrical Stimulation Action IFC   Electrical Stimulation Parameters to tolerance   Electrical Stimulation Goals Pain   Manual Therapy   Joint Mobilization L5/L4, L5/S1 grade II-III CPA.   Soft tissue mobilization Deep pressure in to Rt/Lt glute, lumbar paraspinals and piriformis    Myofascial Release Rt/Lt hip/buttock                PT Education - 03/21/15 1656    Education provided Yes   Education Details to continue with back exercise program as  knee allows and avoid walking to decrease stress through LB from antalgic gait.    Person(s) Educated Patient   Methods Explanation   Comprehension Verbalized understanding          PT Short Term Goals - 02/20/15 1649    PT SHORT TERM GOAL #1   Title Patient verbalizes/demonstrates safe spine positions/movements 02/13/15   Baseline improving safe mobility and traisitional movements.   Time 4   Period Weeks   Status Partially Met   PT SHORT TERM GOAL #2   Title Increase Rt hamstring flexibility to 90 degrees without radicular symptoms 02/13/15   Time 4   Period Weeks   Status On-going   PT SHORT TERM GOAL #3   Title Patient to tolerate 45 min sitting; 15 min standing; 15 min walking 02/13/15   Baseline improving functional activity tolerance   Time 4   Period Weeks   Status Achieved   PT SHORT TERM GOAL #4   Title  Centralizationn of Rt LE radicular symptoms by patient report  02/13/15   Time 4   Period Weeks   Status Achieved           PT Long Term Goals - 03/18/15 1621    PT LONG TERM GOAL #1   Title Patient I in HEP for discharge 03/13/15   Time 8   Period Weeks   Status On-going   PT LONG TERM GOAL #2   Title 5-/5 to 5/5 strength Rt LE 03/13/15   Time 8   Period Weeks   Status On-going   PT LONG TERM GOAL #3   Title Pain decreased to 0/10 to 2/10 90% of day 03/13/15   Time 8   Period Weeks   Status Achieved   PT LONG TERM GOAL #4   Title Decrease FOTO to </= 44% limitatiion 03/13/15   Time 8   Period Weeks   Status On-going               Problem List Patient Active Problem List   Diagnosis Date Noted  . Right lumbar radiculopathy 01/14/2015  . Primary osteoarthritis of both knees 01/14/2015  . Obesity     Ovie Eastep Nilda Simmer PT, MPH 03/21/2015, 4:59 PM  [facility] [place], [place], [address] Phone: [phone]   Fax:  [phone]  Name: Vickie Castillo MRN: [id number] Date of Birth: 09-Feb-1971

## 2015-03-22 ENCOUNTER — Ambulatory Visit (HOSPITAL_COMMUNITY)
Admission: RE | Admit: 2015-03-22 | Discharge: 2015-03-22 | Disposition: A | Discharge status: Home / Self Care | Payer: BC Managed Care – PPO | Source: Ambulatory Visit | Attending: Sports Medicine | Admitting: Sports Medicine

## 2015-03-22 DIAGNOSIS — R937 Abnormal findings on diagnostic imaging of other parts of musculoskeletal system: Secondary | ICD-10-CM | POA: Insufficient documentation

## 2015-03-22 DIAGNOSIS — S83242A Other tear of medial meniscus, current injury, left knee, initial encounter: Secondary | ICD-10-CM | POA: Insufficient documentation

## 2015-03-22 DIAGNOSIS — M170 Bilateral primary osteoarthritis of knee: Secondary | ICD-10-CM | POA: Diagnosis present

## 2015-03-22 DIAGNOSIS — X58XXXA Exposure to other specified factors, initial encounter: Secondary | ICD-10-CM | POA: Insufficient documentation

## 2015-03-22 DIAGNOSIS — M25462 Effusion, left knee: Secondary | ICD-10-CM | POA: Insufficient documentation

## 2015-03-26 ENCOUNTER — Encounter: Payer: Self-pay | Admitting: Sports Medicine

## 2015-03-26 ENCOUNTER — Ambulatory Visit (INDEPENDENT_AMBULATORY_CARE_PROVIDER_SITE_OTHER): Payer: BC Managed Care – PPO | Admitting: Sports Medicine

## 2015-03-26 DIAGNOSIS — M170 Bilateral primary osteoarthritis of knee: Secondary | ICD-10-CM

## 2015-03-26 DIAGNOSIS — E669 Obesity, unspecified: Secondary | ICD-10-CM | POA: Diagnosis not present

## 2015-03-26 MED ORDER — LIRAGLUTIDE -WEIGHT MANAGEMENT 18 MG/3ML ~~LOC~~ SOPN: 3.0000 mg | PEN_INJECTOR | Freq: Every day | SUBCUTANEOUS | Status: DC

## 2015-03-26 MED ORDER — PHENTERMINE HCL 37.5 MG PO TABS: ORAL_TABLET | ORAL | Status: DC

## 2015-03-26 NOTE — Progress Notes (Signed)
  Subjective:    CC: follow-up  HPI: Knee pain: MRI did show significant medial meniscal tearing as well as osteoarthritis, unfortunately she did not get a good enough response to injection.  Obesity: Agreeable to start weight loss treatment.  Past medical history, Surgical history, Family history not pertinant except as noted below, Social history, Allergies, and medications have been entered into the medical record, reviewed, and no changes needed.   Review of Systems: No fevers, chills, night sweats, weight loss, chest pain, or shortness of breath.   Objective:    General: Well Developed, well nourished, and in no acute distress.  Neuro: Alert and oriented x3, extra-ocular muscles intact, sensation grossly intact.  HEENT: Normocephalic, atraumatic, pupils equal round reactive to light, neck supple, no masses, no lymphadenopathy, thyroid nonpalpable.  Skin: Warm and dry, no rashes. Cardiac: Regular rate and rhythm, no murmurs rubs or gallops, no lower extremity edema.  Respiratory: Clear to auscultation bilaterally. Not using accessory muscles, speaking in full sentences.  Impression and Recommendations:   I spent 25 minutes with this patient, greater than 50% was face-to-face time counseling regarding the above diagnoses

## 2015-03-26 NOTE — Assessment & Plan Note (Signed)
Starting phentermine and Saxenda, referral to nutrition, return monthly for weight checks and refills.

## 2015-03-26 NOTE — Assessment & Plan Note (Signed)
Moderate to severe osteoarthritis with radial tearing of the medial meniscus that has failed conservative measures including injection. She does need arthroscopy, referral placed.

## 2015-03-27 ENCOUNTER — Encounter: Payer: BC Managed Care – PPO | Admitting: Physical Therapy

## 2015-03-28 ENCOUNTER — Other Ambulatory Visit: Payer: Self-pay | Admitting: Sports Medicine

## 2015-03-28 ENCOUNTER — Encounter: Payer: BC Managed Care – PPO | Admitting: Physical Therapy

## 2015-03-28 DIAGNOSIS — E669 Obesity, unspecified: Secondary | ICD-10-CM

## 2015-03-28 MED ORDER — LIRAGLUTIDE -WEIGHT MANAGEMENT 18 MG/3ML ~~LOC~~ SOPN: 3.0000 mg | PEN_INJECTOR | Freq: Every day | SUBCUTANEOUS | Status: DC

## 2015-03-29 ENCOUNTER — Encounter: Payer: Self-pay | Admitting: Rehabilitative and Restorative Service Providers"

## 2015-03-29 ENCOUNTER — Ambulatory Visit (INDEPENDENT_AMBULATORY_CARE_PROVIDER_SITE_OTHER): Payer: BC Managed Care – PPO | Admitting: Rehabilitative and Restorative Service Providers"

## 2015-03-29 DIAGNOSIS — Z7409 Other reduced mobility: Secondary | ICD-10-CM

## 2015-03-29 DIAGNOSIS — M5416 Radiculopathy, lumbar region: Secondary | ICD-10-CM

## 2015-03-29 DIAGNOSIS — R29898 Other symptoms and signs involving the musculoskeletal system: Secondary | ICD-10-CM

## 2015-03-29 DIAGNOSIS — M623 Immobility syndrome (paraplegic): Secondary | ICD-10-CM

## 2015-03-29 DIAGNOSIS — R531 Weakness: Secondary | ICD-10-CM

## 2015-03-29 DIAGNOSIS — M2560 Stiffness of unspecified joint, not elsewhere classified: Secondary | ICD-10-CM

## 2015-03-29 DIAGNOSIS — IMO0001 Reserved for inherently not codable concepts without codable children: Secondary | ICD-10-CM

## 2015-03-29 NOTE — Therapy (Signed)
[place], [place], [address] Phone: [phone]   Fax:  [phone]  Physical Therapy Treatment  Patient Details  Name: Vickie Castillo MRN: [id number] Date of Birth: 1970/11/17 No Data Recorded  Encounter Date: 03/29/2015      PT End of Session - 03/29/15 1414    Visit Number 11   Number of Visits 16   Date for PT Re-Evaluation 04/21/15   PT Start Time 7001   PT Stop Time 1451   PT Time Calculation (min) 48 min   Activity Tolerance Patient tolerated treatment well      Past Medical History  Diagnosis Date  . Seasonal allergies   . Fibroids   . Obesity     Past Surgical History  Procedure Laterality Date  . Myomectomy    . Abdominal hysterectomy    . Cesarean section      There were no vitals filed for this visit.  Visit Diagnosis:  Lumbar radiculopathy  Weakness of right lower extremity  Stiffness due to immobility  Decreased strength, endurance, and mobility  Radicular pain of right lower back      Subjective Assessment - 03/29/15 1404    Subjective MRI shows torn meniscus. Scheduled to see surgeon Tuesday 04/02/15. Back is hurting more since she has been limping.    Currently in Pain? No/denies   Pain Score 4    Pain Location Back   Pain Orientation Right;Left   Pain Descriptors / Indicators Dull;Aching   Pain Score 8   Pain Location Knee   Pain Orientation Left                         [facility] Adult PT Treatment/Exercise - 03/29/15 0001    Exercises   Exercises Lumbar   Lumbar Exercises: Stretches   Passive Hamstring Stretch 3 reps;30 seconds   Passive Hamstring Stretch Limitations supine opposite knee bent w/ strap   Prone on Elbows Stretch 60 seconds   Press Ups Limitations press up 3-4 sec hold 10 reps    Piriformis Stretch --  unable to tol d/t knee pain    Lumbar Exercises: Aerobic   Stationary Bike unable to tolerate due to knee pain   Lumbar Exercises: Prone   Other Prone Lumbar Exercises prone pelvic press x10   Cryotherapy   Number Minutes Cryotherapy 15 Minutes   Cryotherapy Location Lumbar Spine;Knee   Type of Cryotherapy Ice pack   Electrical Stimulation   Electrical Stimulation Location Rt SI/ piriformis   Electrical Stimulation Action IFC   Electrical Stimulation Parameters to tolerance   Electrical Stimulation Goals Pain   Manual Therapy   Joint Mobilization L5/L4, L5/S1 grade II-III CPA.   Soft tissue mobilization Deep pressure in to Rt/Lt glute, lumbar paraspinals and piriformis    Myofascial Release Rt/Lt hip/buttock                PT Education - 03/29/15 1413    Education provided Yes   Education Details continue with home exercise as tolerated with Lt knee pain to avoid walking to decrease stress through LB from antalig gait   Person(s) Educated Patient   Methods Explanation   Comprehension Verbalized understanding          PT Short Term Goals - 02/20/15 1649    PT SHORT TERM GOAL #1   Title Patient verbalizes/demonstrates safe spine positions/movements 02/13/15   Baseline improving safe mobility and traisitional movements.  Time 4   Period Weeks   Status Partially Met   PT SHORT TERM GOAL #2   Title Increase Rt hamstring flexibility to 90 degrees without radicular symptoms 02/13/15   Time 4   Period Weeks   Status On-going   PT SHORT TERM GOAL #3   Title Patient to tolerate 45 min sitting; 15 min standing; 15 min walking 02/13/15   Baseline improving functional activity tolerance   Time 4   Period Weeks   Status Achieved   PT SHORT TERM GOAL #4   Title Centralizationn of Rt LE radicular symptoms by patient report  02/13/15   Time 4   Period Weeks   Status Achieved           PT Long Term Goals - 03/18/15 1621    PT LONG TERM GOAL #1   Title Patient I in HEP for discharge 03/13/15   Time 8   Period Weeks   Status On-going   PT LONG TERM GOAL #2   Title 5-/5 to  5/5 strength Rt LE 03/13/15   Time 8   Period Weeks   Status On-going   PT LONG TERM GOAL #3   Title Pain decreased to 0/10 to 2/10 90% of day 03/13/15   Time 8   Period Weeks   Status Achieved   PT LONG TERM GOAL #4   Title Decrease FOTO to </= 44% limitatiion 03/13/15   Time 8   Period Weeks   Status On-going               Plan - 03/29/15 1416    Clinical Impression Statement Patient presents with flare up of LBP and Rt hip pain related to knee problems and antalgic gait due to torn meniscus on Lt. Will continue with spine care and work on HEP as tolerated.    Pt will benefit from skilled therapeutic intervention in order to improve on the following deficits Pain;Decreased range of motion;Decreased strength;Abnormal gait;Improper body mechanics;Decreased activity tolerance   Rehab Potential Good   PT Frequency 2x / week   PT Duration 8 weeks   PT Treatment/Interventions Patient/family education;ADLs/Self Care Home Management;Therapeutic exercise;Therapeutic activities;Manual techniques;Cryotherapy;Electrical Stimulation;Moist Heat;Ultrasound;Traction;Neuromuscular re-education;Dry needling   PT Next Visit Plan Continue progressive core stabilization and spine education.     PT Home Exercise Plan spine care; 3 part core; stretching; extension program; stabilization    Consulted and Agree with Plan of Care Patient        Problem List Patient Active Problem List   Diagnosis Date Noted  . Right lumbar radiculopathy 01/14/2015  . Primary osteoarthritis of both knees 01/14/2015  . Obesity     Tydus Sanmiguel Nilda Simmer PT, MPH 03/29/2015, 2:48 PM  [facility] [place], [place], [address] Phone: [phone]   Fax:  [phone]  Name: Vickie Castillo MRN: [id number] Date of Birth: 1971-04-23

## 2015-04-05 ENCOUNTER — Encounter: Payer: BC Managed Care – PPO | Admitting: Physical Therapy

## 2015-04-05 HISTORY — PX: KNEE SURGERY: SHX244

## 2015-04-08 ENCOUNTER — Other Ambulatory Visit: Payer: Self-pay | Admitting: Sports Medicine

## 2015-04-12 ENCOUNTER — Ambulatory Visit (INDEPENDENT_AMBULATORY_CARE_PROVIDER_SITE_OTHER): Payer: BC Managed Care – PPO | Admitting: Rehabilitative and Restorative Service Providers"

## 2015-04-12 ENCOUNTER — Encounter: Payer: Self-pay | Admitting: Rehabilitative and Restorative Service Providers"

## 2015-04-12 DIAGNOSIS — M25662 Stiffness of left knee, not elsewhere classified: Secondary | ICD-10-CM

## 2015-04-12 DIAGNOSIS — Z7409 Other reduced mobility: Secondary | ICD-10-CM

## 2015-04-12 DIAGNOSIS — M25562 Pain in left knee: Secondary | ICD-10-CM

## 2015-04-12 DIAGNOSIS — M5416 Radiculopathy, lumbar region: Secondary | ICD-10-CM

## 2015-04-12 DIAGNOSIS — R29898 Other symptoms and signs involving the musculoskeletal system: Secondary | ICD-10-CM

## 2015-04-12 DIAGNOSIS — R531 Weakness: Secondary | ICD-10-CM

## 2015-04-12 DIAGNOSIS — R6889 Other general symptoms and signs: Secondary | ICD-10-CM

## 2015-04-12 DIAGNOSIS — R269 Unspecified abnormalities of gait and mobility: Secondary | ICD-10-CM | POA: Diagnosis not present

## 2015-04-12 NOTE — Patient Instructions (Signed)
For swelling ankle pumps/circles/A to Z with toes   Quad Set    With other leg bent, foot flat, slowly tighten muscles on thigh of straight leg while counting out loud to _10___. Repeat with other leg. Repeat __10__ times. 1-2 sets Do __2-3__ sessions per day.   HIP: Flexion / KNEE: Extension, Straight Leg Raise    Raise leg, keeping knee straight. Hold 5 sec Perform slowly. _10__ reps per set, __1-3_ sets per day, _2__ days per week  HIP: Hamstrings - Supine    Place strap around foot. Raise leg up, keep knee straight. Hold _30__ seconds. _3__ reps per set, _2-3__ sets per day    Self-Mobilization: Knee Flexion (Hook-Lying)    Do not use Rt leg to slide Lt leg  Bend left knee as far as possible, then use strap to gently pull knee up until stretch is felt. Hold _10___ seconds. Relax. Repeat __10__ times per set. Do ___1_ sets per session. Do __2-3__ sessions per day.

## 2015-04-12 NOTE — Therapy (Signed)
[place], [place], [address] Phone: [phone]   Fax:  [phone]  Physical Therapy Treatment  Patient Details  Name: Vickie Castillo MRN: [id number] Date of Birth: 12/02/1970 Referring Provider: Dr. Frederik Pear  Encounter Date: 04/12/2015      PT End of Session - 04/12/15 1520    Visit Number 1   Number of Visits 12   Date for PT Re-Evaluation 05/24/15   PT Start Time [id number]   PT Stop Time 1509   PT Time Calculation (min) 65 min   Activity Tolerance Patient tolerated treatment well      Past Medical History  Diagnosis Date  . Seasonal allergies   . Fibroids   . Obesity     Past Surgical History  Procedure Laterality Date  . Myomectomy    . Abdominal hysterectomy    . Cesarean section      There were no vitals filed for this visit.  Visit Diagnosis:  Knee pain, acute, left - Plan: PT plan of care cert/re-cert  Weakness of left leg - Plan: PT plan of care cert/re-cert  Stiffness of knee joint, left - Plan: PT plan of care cert/re-cert  Abnormality of gait - Plan: PT plan of care cert/re-cert  Lumbar radiculopathy - Plan: PT plan of care cert/re-cert  Decreased strength, endurance, and mobility - Plan: PT plan of care cert/re-cert          [facility] PT Assessment - 04/12/15 0001    Assessment   Medical Diagnosis Lt knee arthroscopic surgery/torn meniscus and cartilage   Referring Provider Dr. Frederik Pear   Onset Date/Surgical Date 04/04/05   Hand Dominance Right   Next MD Visit 12/16   Precautions   Precaution Comments avoid pain out of work through 04/22/15 return light duty   Restrictions   Weight Bearing Restrictions No   Balance Screen   Has the patient fallen in the past 6 months No   Has the patient had a decrease in activity level because of a fear of falling?  No   Is the patient reluctant to leave their home because of a fear of falling?  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   Leisure household chores/4 children- church   Observation/Other Assessments   Focus on Therapeutic Outcomes (FOTO)  62% limitation    Sensation   Additional Comments WFL's per patient report   Posture/Postural Control   Posture Comments standing with weight shifted to Rt   AROM   Right/Left Hip --  WFL's bilat hip   Right Knee Extension 4  hyperextended   Right Knee Flexion 120   Left Knee Extension 91   Left Knee Flexion -8   Lumbar Flexion 75%   Lumbar Extension 40%   Lumbar - Right Side Bend 60%   Lumbar - Left Side Bend 55%   Lumbar - Right Rotation 20%   Lumbar - Left Rotation 25%   Strength   Right Hip Extension 4+/5   Right Hip ABduction 4/5   Left Hip Extension --  5-/5   Left Hip ABduction --  5-/5   Right Knee Flexion 5/5   Right Knee Extension 5/5   Left Knee Flexion 4+/5   Left Knee Extension  4/5   Flexibility   Hamstrings 89 deg Lt; 80 deg Rt    Palpation   Spinal mobility pain with PA spring testing sacrum/L5/L4 and Rt lateral spring testing L5/L4   SI assessment  minimal sensitive Rt   Palpation comment tight and tender in peripatellar area and distal quads; muscular tightness and pain Rt QL/piriformis/hip abductors sacrum to greater trochanter. Pain in Rt buttocks with palpation through Lt hip abductors/extensors   Ambulation/Gait   Gait Comments Antalgic gait with limp on Lt LE; Lt LE ER; decreased weight bearing Lt LE                      [facility] Adult PT Treatment/Exercise - 04/12/15 0001    Lumbar Exercises: Stretches   Prone on Elbows Stretch 60 seconds   Press Ups Limitations press up 3-4 sec hold 10 reps    Lumbar Exercises: Supine   Ab Set 10 reps  3 part core    Lumbar Exercises:  Prone   Other Prone Lumbar Exercises prone pelvic press x10   Knee/Hip Exercises: Stretches   Passive Hamstring Stretch 2 reps;30 seconds   Knee: Self-Stretch to increase Flexion Left;5 reps;10 seconds  assist with belt around thigh   Knee/Hip Exercises: Supine   Quad Sets Strengthening;Left;10 reps  10 sec hold   Heel Slides AAROM;Left;10 reps   Straight Leg Raises Strengthening;Left;10 reps  5 sec hold   Straight Leg Raise with External Rotation Strengthening;Left;10 reps  5 sec hold   Cryotherapy   Number Minutes Cryotherapy 15 Minutes   Cryotherapy Location Lumbar Spine;Knee   Type of Cryotherapy Ice pack   Electrical Stimulation   Electrical Stimulation Location Rt SI/ piriformis; Lt knee   Electrical Stimulation Action IFC/ion repelling   Electrical Stimulation Parameters to tolerance    Electrical Stimulation Goals Pain   Vasopneumatic   Number Minutes Vasopneumatic  15 minutes   Vasopnuematic Location  Knee   Vasopneumatic Pressure Medium   Vasopneumatic Temperature  *3   Manual Therapy   Joint Mobilization L5/L4, L5/S1 grade II-III CPA.   Soft tissue mobilization Deep pressure in to Rt/Lt glute, lumbar paraspinals and piriformis    Myofascial Release Rt/Lt hip/buttock                PT Education - 04/12/15 1507    Education provided Yes   Education Details HEP Lt knee; resume back program    Person(s) Educated Patient   Methods Explanation;Demonstration;Tactile cues;Verbal cues;Handout   Comprehension Verbalized understanding;Returned demonstration;Verbal cues required;Tactile cues required          PT Short Term Goals - 04/12/15 1525    PT SHORT TERM GOAL #1   Title Patient verbalizes/demonstrates safe spine positions/movements 05/03/15   Time 2   Period Weeks   Status New   PT SHORT TERM GOAL #2   Title Increase Rt hamstring flexibility to 90 degrees bilat 05/03/15/16   Time 2   Period Weeks   Status New   PT SHORT TERM GOAL #3   Title  Patient to tolerate 45 min sitting; 15 min standing; 15 min walking 05/10/15   Time 3   Period Weeks   Status New   PT SHORT TERM GOAL #4   Title 105 knee flexion to o ext Lt knee 05/10/15   Time 3   Period Weeks   Status New           PT Long Term Goals - 04/12/15 1527  PT LONG TERM GOAL #1   Title Patient I in HEP for discharge 05/24/15   Time 6   Period Weeks   Status New   PT LONG TERM GOAL #2   Title 5-/5 to 5/5 strength Rt/Lt  LE 05/24/15   Time 6   Period Weeks   Status New   PT LONG TERM GOAL #3   Title Pain decreased to 0/10 to 2/10 90% of day 04/3015   Time 6   Period Weeks   Status New   PT LONG TERM GOAL #4   Title Decrease FOTO to </= 40% limitatiion 05/24/15   Time 6   Period Weeks   Status New               Plan - 04/12/15 1521    Clinical Impression Statement Patient presents s/p Lt knee arthroscopic surgery for torn meniscus 04/05/15. She has limited ROM and strength Lt knee; antalgic gait with limp Lt LE; edema; flare up of LBP related to Lt knee injury. Patient will benefit from PT to address problems identified.    Pt will benefit from skilled therapeutic intervention in order to improve on the following deficits Pain;Decreased range of motion;Decreased strength;Abnormal gait;Improper body mechanics;Decreased activity tolerance   Rehab Potential Good   PT Frequency 2x / week   PT Duration 6 weeks   PT Treatment/Interventions Patient/family education;ADLs/Self Care Home Management;Therapeutic exercise;Therapeutic activities;Manual techniques;Cryotherapy;Electrical Stimulation;Moist Heat;Ultrasound;Traction;Neuromuscular re-education;Dry needling   PT Next Visit Plan progress knee rehab working on ROM; strength; stability Lt LE; continue core stabilization and strengthening for lumbar disc    PT Home Exercise Plan HEP Lt knee for ROM and strength; spine care; 3 part core; stretching; extension program; stabilization    Consulted and Agree  with Plan of Care Patient        Problem List Patient Active Problem List   Diagnosis Date Noted  . Right lumbar radiculopathy 01/14/2015  . Primary osteoarthritis of both knees 01/14/2015  . Obesity     Rueben Kassim Nilda Simmer  PT, MPH  04/12/2015, 3:42 PM  [facility] [place], [place], [address] Phone: [phone]   Fax:  [phone]  Name: Vickie Castillo MRN: [id number] Date of Birth: 1971-05-11

## 2015-04-16 ENCOUNTER — Ambulatory Visit (INDEPENDENT_AMBULATORY_CARE_PROVIDER_SITE_OTHER): Payer: BC Managed Care – PPO | Admitting: Physical Therapy

## 2015-04-16 DIAGNOSIS — R29898 Other symptoms and signs involving the musculoskeletal system: Secondary | ICD-10-CM

## 2015-04-16 DIAGNOSIS — M25662 Stiffness of left knee, not elsewhere classified: Secondary | ICD-10-CM

## 2015-04-16 DIAGNOSIS — M25562 Pain in left knee: Secondary | ICD-10-CM | POA: Diagnosis not present

## 2015-04-16 DIAGNOSIS — M5416 Radiculopathy, lumbar region: Secondary | ICD-10-CM

## 2015-04-16 NOTE — Therapy (Signed)
[place], [place], [address] Phone: [phone]   Fax:  [phone]  Physical Therapy Treatment  Patient Details  Name: Vickie Castillo MRN: [id number] Date of Birth: 1970/10/30 Referring Provider: Dr. Ulyses Southward  Encounter Date: 04/16/2015      PT End of Session - 04/16/15 1441    Visit Number 2   Number of Visits 12   Date for PT Re-Evaluation 05/24/15   PT Start Time 1435   PT Stop Time 1531   PT Time Calculation (min) 56 min   Activity Tolerance Patient tolerated treatment well      Past Medical History  Diagnosis Date  . Seasonal allergies   . Fibroids   . Obesity     Past Surgical History  Procedure Laterality Date  . Myomectomy    . Abdominal hysterectomy    . Cesarean section      There were no vitals filed for this visit.  Visit Diagnosis:  Knee pain, acute, left  Weakness of left leg  Stiffness of knee joint, left  Lumbar radiculopathy      Subjective Assessment - 04/16/15 1442    Currently in Pain? Yes   Pain Score 1    Pain Location Knee   Pain Orientation Left   Aggravating Factors  worse at night, stairs    Pain Relieving Factors off loading LE, ice    Pain Score 4   Pain Location Buttocks   Pain Orientation Right   Pain Descriptors / Indicators --  grabbing pain   Aggravating Factors  bad posture   Pain Relieving Factors self massage             [facility] PT Assessment - 04/16/15 0001    Assessment   Medical Diagnosis Lt knee arthroscopic surgery/torn meniscus and cartilage   Referring Provider Dr. Ulyses Southward   Onset Date/Surgical Date 04/04/05   Hand Dominance Right   Next MD Visit 12/16   ROM / Strength   AROM / PROM / Strength AROM   AROM   Right/Left Knee Left   Left Knee Extension 0   Left Knee Flexion 106            [facility] Adult PT Treatment/Exercise - 04/16/15 0001    Lumbar Exercises: Stretches   Prone on Elbows Stretch 60  seconds   Press Ups Limitations press up 3-4 sec hold 6 reps   sacrum anchored by PTA.    Lumbar Exercises: Prone   Straight Leg Raise 10 reps  each leg   Knee/Hip Exercises: Stretches   Quad Stretch 4 reps;20 seconds;Left   Knee/Hip Exercises: Aerobic   Nustep L3: 5 min    Knee/Hip Exercises: Supine   Quad Sets Strengthening;Left;10 reps  10 sec hold   Heel Slides Left;AROM;5 reps   Straight Leg Raises Strengthening;Left;Right;1 set;10 reps   Straight Leg Raise with External Rotation Strengthening;Left;1 set;10 reps   Cryotherapy   Number Minutes Cryotherapy 15 Minutes   Cryotherapy Location Lumbar Spine   Type of Cryotherapy Ice pack   Electrical Stimulation   Electrical Stimulation Location Rt SI/ piriformis; Lt knee   Electrical Stimulation Action IFC/ ion repelling   Electrical Stimulation Parameters to tolerance    Electrical Stimulation Goals Pain   Vasopneumatic   Number Minutes Vasopneumatic  15 minutes   Vasopnuematic Location  Knee   Vasopneumatic Pressure Medium   Vasopneumatic Temperature  *3   Manual Therapy   Soft tissue mobilization  Deep pressure in to Rt/Lt glute, lumbar  piriformis    Myofascial Release Rt glute                  PT Short Term Goals - 04/12/15 1525    PT SHORT TERM GOAL #1   Title Patient verbalizes/demonstrates safe spine positions/movements 05/03/15   Time 2   Period Weeks   Status New   PT SHORT TERM GOAL #2   Title Increase Rt hamstring flexibility to 90 degrees bilat 05/03/15/16   Time 2   Period Weeks   Status New   PT SHORT TERM GOAL #3   Title Patient to tolerate 45 min sitting; 15 min standing; 15 min walking 05/10/15   Time 3   Period Weeks   Status New   PT SHORT TERM GOAL #4   Title 105 knee flexion to o ext Lt knee 05/10/15   Time 3   Period Weeks   Status New           PT Long Term Goals - 04/12/15 1527    PT LONG TERM GOAL #1   Title Patient I in HEP for discharge 05/24/15   Time 6   Period  Weeks   Status New   PT LONG TERM GOAL #2   Title 5-/5 to 5/5 strength Rt/Lt  LE 05/24/15   Time 6   Period Weeks   Status New   PT LONG TERM GOAL #3   Title Pain decreased to 0/10 to 2/10 90% of day 04/3015   Time 6   Period Weeks   Status New   PT LONG TERM GOAL #4   Title Decrease FOTO to </= 40% limitatiion 05/24/15   Time 6   Period Weeks   Status New               Plan - 04/16/15 1549    Clinical Impression Statement Pt demonstrated improved Lt knee ROM this visit.  Pt limited by pain in Rt LB and Lt knee. Pt has met STG #4.     Pt will benefit from skilled therapeutic intervention in order to improve on the following deficits Pain;Decreased range of motion;Decreased strength;Abnormal gait;Improper body mechanics;Decreased activity tolerance   Rehab Potential Good   PT Frequency 2x / week   PT Duration 6 weeks   PT Treatment/Interventions Patient/family education;ADLs/Self Care Home Management;Therapeutic exercise;Therapeutic activities;Manual techniques;Cryotherapy;Electrical Stimulation;Moist Heat;Ultrasound;Traction;Neuromuscular re-education;Dry needling   PT Next Visit Plan progress knee rehab working on ROM; strength; stability Lt LE; continue core stabilization and strengthening for lumbar disc    Consulted and Agree with Plan of Care Patient        Problem List Patient Active Problem List   Diagnosis Date Noted  . Right lumbar radiculopathy 01/14/2015  . Primary osteoarthritis of both knees 01/14/2015  . Obesity     Kerin Perna, PTA 04/16/2015 5:04 PM  [place], [place], [address] Phone: [phone]   Fax:  [phone]  Name: Vickie Castillo MRN: [id number] Date of Birth: 1971/03/12

## 2015-04-22 ENCOUNTER — Encounter: Payer: Self-pay | Admitting: Rehabilitative and Restorative Service Providers"

## 2015-04-22 ENCOUNTER — Ambulatory Visit (INDEPENDENT_AMBULATORY_CARE_PROVIDER_SITE_OTHER): Payer: BC Managed Care – PPO | Admitting: Rehabilitative and Restorative Service Providers"

## 2015-04-22 DIAGNOSIS — M5416 Radiculopathy, lumbar region: Secondary | ICD-10-CM | POA: Diagnosis not present

## 2015-04-22 DIAGNOSIS — M25562 Pain in left knee: Secondary | ICD-10-CM

## 2015-04-22 DIAGNOSIS — R6889 Other general symptoms and signs: Secondary | ICD-10-CM

## 2015-04-22 DIAGNOSIS — M25662 Stiffness of left knee, not elsewhere classified: Secondary | ICD-10-CM | POA: Diagnosis not present

## 2015-04-22 DIAGNOSIS — R531 Weakness: Secondary | ICD-10-CM

## 2015-04-22 DIAGNOSIS — R29898 Other symptoms and signs involving the musculoskeletal system: Secondary | ICD-10-CM

## 2015-04-22 DIAGNOSIS — Z7409 Other reduced mobility: Secondary | ICD-10-CM

## 2015-04-22 DIAGNOSIS — M2560 Stiffness of unspecified joint, not elsewhere classified: Secondary | ICD-10-CM

## 2015-04-22 DIAGNOSIS — M623 Immobility syndrome (paraplegic): Secondary | ICD-10-CM

## 2015-04-22 DIAGNOSIS — IMO0001 Reserved for inherently not codable concepts without codable children: Secondary | ICD-10-CM

## 2015-04-22 DIAGNOSIS — R269 Unspecified abnormalities of gait and mobility: Secondary | ICD-10-CM

## 2015-04-22 NOTE — Patient Instructions (Signed)
Forward    Facing step, place left leg on step,step up slowly, bringing hips in line with knee and shoulder. Bring right foot onto step. Reverse process to step back down with right foot. 2-4 inch book  Do _10___ repetitions 2-3 x/day.  FUNCTIONAL MOBILITY: Wall Squat    Stance: shoulder-width on floor, against wall. Place feet in front of hips. Bend hips and knees - slightly staying in pain free range.  Keep back straight. Do not allow knees to bend past toes. Squeeze glutes and quads to stand. __5_ reps per set, _2__ sets, __2-3 x/day

## 2015-04-22 NOTE — Therapy (Signed)
[facility] [place], [place], [address] Phone: [phone]   Fax:  [phone]  Physical Therapy Treatment  Patient Details  Name: Vickie Castillo MRN: [id number] Date of Birth: 08-07-1970 Referring Provider: Dr. Frederik Pear  Encounter Date: 04/22/2015      PT End of Session - 04/22/15 1652    Visit Number 3   Number of Visits 12   Date for PT Re-Evaluation 05/24/15   PT Start Time 1604   PT Stop Time [id number]   PT Time Calculation (min) 49 min   Activity Tolerance Patient tolerated treatment well      Past Medical History  Diagnosis Date  . Seasonal allergies   . Fibroids   . Obesity     Past Surgical History  Procedure Laterality Date  . Myomectomy    . Abdominal hysterectomy    . Cesarean section      There were no vitals filed for this visit.  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 She has had some twinges but not much pain. She is tired.    Currently in Pain? Yes   Pain Score 1    Pain Location Knee   Pain Orientation Left   Pain Descriptors / Indicators Dull;Aching   Pain Type Chronic pain   Pain Score 0   Pain Location Buttocks   Pain Orientation Right   Pain Onset More than a month ago            [facility] PT Assessment - 04/22/15 0001    Assessment   Medical Diagnosis Lt knee arthroscopic surgery/torn meniscus and cartilage   Referring Provider Dr. Frederik Pear   Onset Date/Surgical Date 04/04/05   Hand Dominance Right   Next MD Visit 05/09/15   AROM   Left Knee Extension 0   Left Knee Flexion 112                      [facility] Adult PT Treatment/Exercise - 04/22/15 0001    Ambulation/Gait   Gait Comments improving gait pattern with increased wt bearing through Lt LE    Lumbar Exercises: Stretches   Passive Hamstring Stretch 3 reps;30 seconds   Passive Hamstring Stretch Limitations supine opposite knee bent w/ strap   Lumbar Exercises: Supine   Heel Slides 10 reps   Other Supine Lumbar Exercises adductor squeeze small ball x10   Lumbar Exercises: Sidelying   Hip Abduction 10 reps  Lt    Knee/Hip Exercises: Aerobic   Nustep L3 x 5 min    Knee/Hip Exercises: Standing   Forward Step Up Left;1 set;10 reps;Hand Hold: 1;Step Height: 4"   Wall Squat 5 reps;5 seconds  3-4 inch bend only - pain free range    Knee/Hip Exercises: Supine   Quad Sets Strengthening;Left;10 reps   Heel Slides Left;AROM;5 reps   Hip Adduction Isometric Both;10 reps  5 sec hold    Bridges with [employer] Both;2 sets;5 reps  3 sec hold    Straight Leg Raises Strengthening;Left;Right;1 set;10 reps   Straight Leg Raise with External  Rotation Strengthening;Left;1 set;10 reps   Knee/Hip Exercises: Prone   Hamstring Curl 10 reps;1 second  Lt   Hip Extension Limitations glut sets 10 sec hold x 10 reps    Cryotherapy   Number Minutes Cryotherapy 15 Minutes   Cryotherapy Location Lumbar Spine   Type of Cryotherapy Ice pack   Electrical Stimulation   Electrical Stimulation Location Rt SI/ piriformis; Lt knee   Electrical Stimulation Action IFC/ion repelling   Electrical Stimulation Parameters to tolerance    Electrical Stimulation Goals Pain   Vasopneumatic   Number Minutes Vasopneumatic  15 minutes   Vasopnuematic Location  Knee   Vasopneumatic Pressure Medium   Vasopneumatic Temperature  *3   Manual Therapy   Joint Mobilization L5/L4, L5/S1 grade II-III CPA.   Soft tissue mobilization Deep pressure in to Rt/Lt glute, lumbar  piriformis    Myofascial Release Rt glute                 PT Education - 04/22/15 1648    Education provided Yes   Education Details HEP    Person(s) Educated Patient   Methods Explanation;Demonstration;Tactile cues;Verbal cues;Handout   Comprehension Verbalized understanding;Returned demonstration;Verbal cues required;Tactile cues required          PT Short Term Goals - 04/12/15 1525    PT SHORT TERM GOAL #1   Title Patient verbalizes/demonstrates safe spine positions/movements 05/03/15   Time 2   Period Weeks   Status New   PT SHORT TERM GOAL #2   Title Increase Rt hamstring flexibility to 90 degrees bilat 05/03/15/16   Time 2   Period Weeks   Status New   PT SHORT TERM GOAL #3   Title Patient to tolerate 45 min sitting; 15 min standing; 15 min walking 05/10/15   Time 3   Period Weeks   Status New   PT SHORT TERM GOAL #4   Title 105 knee flexion to o ext Lt knee 05/10/15   Time 3   Period Weeks   Status New           PT Long Term Goals - 04/12/15 1527    PT LONG TERM GOAL #1   Title Patient I in HEP for discharge 05/24/15   Time 6   Period Weeks   Status New   PT LONG TERM GOAL #2   Title 5-/5 to 5/5 strength Rt/Lt  LE 05/24/15   Time 6   Period Weeks   Status New   PT LONG TERM GOAL #3   Title Pain decreased to 0/10 to 2/10 90% of day 04/3015   Time 6   Period Weeks   Status New   PT LONG TERM GOAL #4   Title Decrease FOTO to </= 40% limitatiion 05/24/15   Time 6   Period Weeks   Status New               Plan - 04/22/15 1653    Clinical Impression Statement Continued improvement in ROM Lt knee. Tolerated increased exercise with min pain. Progressing well toward stated goals of therapy.    Pt will benefit from skilled therapeutic intervention in order to improve on the following deficits Pain;Decreased range of motion;Decreased strength;Abnormal gait;Improper body mechanics;Decreased activity tolerance   Rehab Potential Good   PT Frequency 2x / week   PT Duration 6 weeks   PT  Treatment/Interventions Patient/family education;ADLs/Self Care Home Management;Therapeutic exercise;Therapeutic activities;Manual techniques;Cryotherapy;Electrical Stimulation;Moist Heat;Ultrasound;Traction;Neuromuscular re-education;Dry needling   PT Next Visit Plan progress knee  rehab working on ROM; strength; stability Lt LE; continue core stabilization and strengthening for lumbar disc    PT Home Exercise Plan HEP Lt knee for ROM and strength; spine care; 3 part core; stretching; extension program; stabilization    Consulted and Agree with Plan of Care Patient        Problem List Patient Active Problem List   Diagnosis Date Noted  . Right lumbar radiculopathy 01/14/2015  . Primary osteoarthritis of both knees 01/14/2015  . Obesity     Samar Dass Nilda Simmer PT, MPH 04/22/2015, 4:56 PM  [facility] [place], [place], [address] Phone: [phone]   Fax:  [phone]  Name: Vickie Castillo MRN: [id number] Date of Birth: 1971/04/14

## 2015-04-24 ENCOUNTER — Encounter: Payer: Self-pay | Admitting: Rehabilitative and Restorative Service Providers"

## 2015-04-24 ENCOUNTER — Ambulatory Visit (INDEPENDENT_AMBULATORY_CARE_PROVIDER_SITE_OTHER): Payer: BC Managed Care – PPO | Admitting: Rehabilitative and Restorative Service Providers"

## 2015-04-24 DIAGNOSIS — Z7409 Other reduced mobility: Secondary | ICD-10-CM

## 2015-04-24 DIAGNOSIS — R29898 Other symptoms and signs involving the musculoskeletal system: Secondary | ICD-10-CM

## 2015-04-24 DIAGNOSIS — M5416 Radiculopathy, lumbar region: Secondary | ICD-10-CM | POA: Diagnosis not present

## 2015-04-24 DIAGNOSIS — M25662 Stiffness of left knee, not elsewhere classified: Secondary | ICD-10-CM

## 2015-04-24 DIAGNOSIS — M25562 Pain in left knee: Secondary | ICD-10-CM

## 2015-04-24 DIAGNOSIS — R531 Weakness: Secondary | ICD-10-CM

## 2015-04-24 DIAGNOSIS — R269 Unspecified abnormalities of gait and mobility: Secondary | ICD-10-CM

## 2015-04-24 NOTE — Therapy (Signed)
[facility] [place]  [place], [place], [address] Phone: [phone]   Fax:  [phone]  Physical Therapy Treatment  Patient Details  Name: Vickie Castillo MRN: [id number] Date of Birth: 09-Aug-1970 Referring Provider: Dr. Frederik Pear  Encounter Date: 04/24/2015      PT End of Session - 04/24/15 1605    Visit Number 4   Number of Visits 12   Date for PT Re-Evaluation 05/24/15   PT Start Time 1601   PT Stop Time [id number]   PT Time Calculation (min) 52 min   Activity Tolerance Patient tolerated treatment well;Patient limited by pain      Past Medical History  Diagnosis Date  . Seasonal allergies   . Fibroids   . Obesity     Past Surgical History  Procedure Laterality Date  . Myomectomy    . Abdominal hysterectomy    . Cesarean section      There were no vitals filed for this visit.  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    Currently in Pain? Yes   Pain Score 5    Pain Location Knee   Pain Orientation Left   Pain Descriptors / Indicators Aching;Dull;Sharp   Aggravating Factors  walking; stairs; increased activity    Pain Relieving Factors rest; ice   Pain Score 2   Pain Location Buttocks   Pain Orientation Right   Pain Type Acute pain   Pain Onset More than a month ago   Pain Frequency Intermittent   Aggravating Factors  limping    Pain Relieving Factors rest; stretching; ball massage                          [facility] Adult PT Treatment/Exercise - 04/24/15 0001    Lumbar Exercises: Stretches   Passive Hamstring Stretch 3 reps;30 seconds    Passive Hamstring Stretch Limitations supine opposite knee bent w/ strap   Press Ups Limitations press up 3-4 sec hold 6 reps    Lumbar Exercises: Supine   AB Set Limitations 3 part core 10 sec x 10 reps   Heel Slides 5 reps   Other Supine Lumbar Exercises adductor squeeze small ball x10   Knee/Hip Exercises: Aerobic   Nustep L3 x 4 min    Knee/Hip Exercises: Supine   Quad Sets Strengthening;Left;10 reps   Heel Slides Left;AROM;5 reps   Hip Adduction Isometric Both;10 reps  5 sec hold    Bridges with Ball Squeeze Both;2 sets;5 reps  3 sec hold    Straight Leg Raises Strengthening;Left;Right;1 set;10 reps   Straight Leg Raise with External Rotation Strengthening;Left;1 set;10 reps   Cryotherapy   Number Minutes Cryotherapy 15 Minutes   Cryotherapy Location Lumbar Spine   Type of Cryotherapy Ice pack   Electrical Stimulation   Electrical Stimulation Location Rt SI/ piriformis; Lt knee   Electrical Stimulation Action IFC/ion repelling    Electrical Stimulation Parameters to tolerance   Electrical Stimulation Goals Pain   Vasopneumatic   Number Minutes Vasopneumatic  15 minutes   Vasopnuematic Location  Knee   Vasopneumatic Pressure Medium   Vasopneumatic Temperature  *3   Manual Therapy   Manual Therapy Joint mobilization   Joint Mobilization L5/L4, L5/S1 grade II-III CPA.   Soft tissue mobilization Deep pressure in to Rt/Lt glute, lumbar  piriformis    Myofascial Release Rt glute                PT Education - 04/24/15 1638    Education provided Yes   Education Details no change in HEP - encouraged pt to rest/elevate/ice LE to calm symptoms down and allow progression of rehab   Person(s) Educated Patient   Methods Explanation   Comprehension Verbalized understanding          PT Short Term Goals - 04/12/15 1525    PT SHORT TERM GOAL #1   Title Patient verbalizes/demonstrates safe spine positions/movements 05/03/15   Time 2   Period Weeks   Status New    PT SHORT TERM GOAL #2   Title Increase Rt hamstring flexibility to 90 degrees bilat 05/03/15/16   Time 2   Period Weeks   Status New   PT SHORT TERM GOAL #3   Title Patient to tolerate 45 min sitting; 15 min standing; 15 min walking 05/10/15   Time 3   Period Weeks   Status New   PT SHORT TERM GOAL #4   Title 105 knee flexion to o ext Lt knee 05/10/15   Time 3   Period Weeks   Status New           PT Long Term Goals - 04/12/15 1527    PT LONG TERM GOAL #1   Title Patient I in HEP for discharge 05/24/15   Time 6   Period Weeks   Status New   PT LONG TERM GOAL #2   Title 5-/5 to 5/5 strength Rt/Lt  LE 05/24/15   Time 6   Period Weeks   Status New   PT LONG TERM GOAL #3   Title Pain decreased to 0/10 to 2/10 90% of day 04/3015   Time 6   Period Weeks   Status New   PT LONG TERM GOAL #4   Title Decrease FOTO to </= 40% limitatiion 05/24/15   Time 6   Period Weeks   Status New               Plan - 04/24/15 1639    Clinical Impression Statement Flare up of pain and edema Lt LE likely related to return to full work days and 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 No increase in exercise tolerated today. Cont with modalities to address pain and edema. Encouraged pt to ice/elevate/rest LE.    Pt will benefit from skilled therapeutic intervention in order to improve on the following deficits Pain;Decreased range of motion;Decreased strength;Abnormal gait;Improper body mechanics;Decreased activity tolerance   Rehab Potential Good   PT Frequency 2x / week   PT Duration 6 weeks   PT Treatment/Interventions Patient/family education;ADLs/Self Care Home Management;Therapeutic exercise;Therapeutic activities;Manual techniques;Cryotherapy;Electrical Stimulation;Moist Heat;Ultrasound;Traction;Neuromuscular re-education;Dry needling   PT Next Visit Plan progress knee  rehab working on ROM; strength; stability Lt LE as tolerated; continue core stabilization and strengthening for lumbar disc    PT Home Exercise Plan HEP Lt knee for ROM and strength; spine care; 3 part core; stretching; extension program; stabilization    Consulted and Agree with  Plan of Care Patient        Problem List Patient Active Problem List   Diagnosis Date Noted  . Right lumbar radiculopathy 01/14/2015  . Primary osteoarthritis of both knees 01/14/2015  . Obesity     Abb Gobert Nilda Simmer PT, MPH 04/24/2015, 4:44 PM  [facility] [place], [place], [address] Phone: [phone]   Fax:  [phone]  Name: Vickie Castillo MRN: [id number] Date of Birth: 1970/11/23

## 2015-04-25 ENCOUNTER — Ambulatory Visit: Payer: BC Managed Care – PPO | Admitting: Physical Therapy

## 2015-05-01 ENCOUNTER — Encounter: Payer: Self-pay | Admitting: Rehabilitative and Restorative Service Providers"

## 2015-05-01 ENCOUNTER — Ambulatory Visit (INDEPENDENT_AMBULATORY_CARE_PROVIDER_SITE_OTHER): Payer: BC Managed Care – PPO | Admitting: Rehabilitative and Restorative Service Providers"

## 2015-05-01 DIAGNOSIS — M2560 Stiffness of unspecified joint, not elsewhere classified: Secondary | ICD-10-CM

## 2015-05-01 DIAGNOSIS — M5416 Radiculopathy, lumbar region: Secondary | ICD-10-CM

## 2015-05-01 DIAGNOSIS — M25562 Pain in left knee: Secondary | ICD-10-CM

## 2015-05-01 DIAGNOSIS — R29898 Other symptoms and signs involving the musculoskeletal system: Secondary | ICD-10-CM | POA: Diagnosis not present

## 2015-05-01 DIAGNOSIS — M25662 Stiffness of left knee, not elsewhere classified: Secondary | ICD-10-CM

## 2015-05-01 DIAGNOSIS — R269 Unspecified abnormalities of gait and mobility: Secondary | ICD-10-CM

## 2015-05-01 DIAGNOSIS — R6889 Other general symptoms and signs: Secondary | ICD-10-CM

## 2015-05-01 DIAGNOSIS — M623 Immobility syndrome (paraplegic): Secondary | ICD-10-CM

## 2015-05-01 DIAGNOSIS — Z7409 Other reduced mobility: Secondary | ICD-10-CM

## 2015-05-01 DIAGNOSIS — R531 Weakness: Secondary | ICD-10-CM

## 2015-05-01 NOTE — Therapy (Signed)
[place], [place], [address] Phone: [phone]   Fax:  [phone]  Physical Therapy Treatment  Patient Details  Name: Vickie Castillo MRN: [id number] Date of Birth: [birth year] Referring Provider: Frederik Pear  Encounter Date: 05/01/2015      PT End of Session - 05/01/15 1616    Visit Number 5   Number of Visits 12   Date for PT Re-Evaluation 05/24/15   PT Start Time 1604   PT Stop Time 1655   PT Time Calculation (min) 51 min   Activity Tolerance Patient tolerated treatment well      Past Medical History  Diagnosis Date  . Seasonal allergies   . Fibroids   . Obesity     Past Surgical History  Procedure Laterality Date  . Myomectomy    . Abdominal hysterectomy    . Cesarean section      There were no vitals filed for this visit.  Visit Diagnosis:  Knee pain, acute, left  Weakness of left leg  Stiffness of knee joint, left  Lumbar radiculopathy  Abnormality of gait  Decreased strength, endurance, and mobility  Stiffness due to immobility      Subjective Assessment - 05/01/15 1616    Subjective Doing OK - still having some pain but is still going like crazy with schedule,    Currently in Pain? Yes   Pain Score 2    Pain Location Knee   Pain Orientation Left   Pain Descriptors / Indicators Aching;Dull;Sharp   Pain Type Chronic pain            [facility] PT Assessment - 05/01/15 0001    Assessment   Medical Diagnosis Lt knee arthroscopic surgery/torn meniscus and cartilage   Referring Provider Frederik Pear   Onset Date/Surgical Date 04/04/05   Hand Dominance Right   Next MD Visit 05/09/15   AROM   Left Knee Extension 0   Left Knee Flexion 114   Strength   Right Hip Extension 5/5   Right Hip ABduction --  5-/5   Left Hip Extension 5/5   Left Hip ABduction 5/5   Left Knee Flexion --  5-/5   Left Knee Extension 4+/5                     [facility] Adult  PT Treatment/Exercise - 05/01/15 0001    Lumbar Exercises: Stretches   Passive Hamstring Stretch 3 reps;30 seconds   Passive Hamstring Stretch Limitations supine opposite knee bent w/ strap   Press Ups Limitations press up 3-4 sec hold 6 reps    Lumbar Exercises: Supine   AB Set Limitations 3 part core 10 sec x 10 reps   Heel Slides 5 reps   Other Supine Lumbar Exercises adductor squeeze small ball x10   Knee/Hip Exercises: Stretches   Quad Stretch 3 reps;30 seconds   Knee/Hip Exercises: Aerobic   Nustep L3 x 7 min    Knee/Hip Exercises: Standing   Forward Step Up Left;10 reps;Hand Hold: 1;Step Height: 4";2 sets   SLS 30 sec x3 reps hand hold assist Lt    SLS with Vectors SLS fwd touch Lt LT x10    Knee/Hip Exercises: Supine   Quad Sets Strengthening;Left;10 reps   Hip Adduction Isometric Both;10 reps  5 sec hold    Bridges with [employer] Both;2 sets;5 reps  3 sec hold    Straight Leg Raise with External Rotation Strengthening;Left;1 set;10 reps  Knee/Hip Exercises: Prone   Hip Extension Limitations glut sets 10 sec hold x 10 reps    Cryotherapy   Number Minutes Cryotherapy 15 Minutes   Cryotherapy Location Lumbar Spine   Type of Cryotherapy Ice pack   Electrical Stimulation   Electrical Stimulation Location Rt SI/ piriformis; Lt knee   Electrical Stimulation Action IFC/ion repelling    Electrical Stimulation Parameters to tolerance    Electrical Stimulation Goals Pain   Vasopneumatic   Number Minutes Vasopneumatic  15 minutes   Vasopnuematic Location  Knee   Vasopneumatic Pressure Medium   Vasopneumatic Temperature  *3                PT Education - 05/01/15 1700    Education provided Yes   Education Details HEP   Person(s) Educated Patient   Methods Explanation;Demonstration;Tactile cues;Verbal cues;Handout   Comprehension Verbalized understanding;Returned demonstration;Verbal cues required;Tactile cues required          PT Short Term Goals -  05/01/15 1702    PT SHORT TERM GOAL #1   Title Patient verbalizes/demonstrates safe spine positions/movements 05/03/15   Baseline improving safe mobility and traisitional movements.   Time 2   Period Weeks   Status Achieved   PT SHORT TERM GOAL #2   Title Increase Rt hamstring flexibility to 90 degrees bilat 05/03/15/16   Baseline 90 deg bilat    Time 2   Period Weeks   Status Achieved   PT SHORT TERM GOAL #3   Title Patient to tolerate 45 min sitting; 15 min standing; 15 min walking 05/10/15   Baseline improving functional activity tolerance   Time 3   Period Weeks   Status On-going   PT SHORT TERM GOAL #4   Title 105 knee flexion to o ext Lt knee 05/10/15   Time 3   Period Weeks   Status Achieved           PT Long Term Goals - 05/01/15 1703    PT LONG TERM GOAL #1   Title Patient I in HEP for discharge 05/24/15   Time 6   Period Weeks   Status On-going   PT LONG TERM GOAL #2   Title 5-/5 to 5/5 strength Rt/Lt  LE 05/24/15   Time 6   Period Weeks   Status On-going   PT LONG TERM GOAL #3   Title Pain decreased to 0/10 to 2/10 90% of day 04/3015   Time 6   Period Weeks   Status On-going   PT LONG TERM GOAL #4   Title Decrease FOTO to </= 40% limitatiion 05/24/15   Time 6   Period Weeks   Status On-going               Plan - 05/01/15 1700    Clinical Impression Statement Pt has difficulty making time for HEP. Progressing with ROM and strength gradually. Progressing toward stated goals of therpay. RTD next week following PT visit.    Pt will benefit from skilled therapeutic intervention in order to improve on the following deficits Pain;Decreased range of motion;Decreased strength;Abnormal gait;Improper body mechanics;Decreased activity tolerance   Rehab Potential Good   PT Frequency 2x / week   PT Duration 6 weeks   PT Treatment/Interventions Patient/family education;ADLs/Self Care Home Management;Therapeutic exercise;Therapeutic activities;Manual  techniques;Cryotherapy;Electrical Stimulation;Moist Heat;Ultrasound;Traction;Neuromuscular re-education;Dry needling   PT Next Visit Plan progress knee rehab working on ROM; strength; stability Lt LE as tolerated; continue core stabilization and strengthening for lumbar disc; note to  MD at next visit    PT [place] knee for ROM and strength; spine care; 3 part core; stretching; extension program; stabilization    Consulted and Agree with Plan of Care Patient        Problem List Patient Active Problem List   Diagnosis Date Noted  . Right lumbar radiculopathy 01/14/2015  . Primary osteoarthritis of both knees 01/14/2015  . Obesity     Celyn Nilda Simmer PT, MPH  05/01/2015, 5:04 PM  [facility] [place], [place], [address] Phone: [phone]   Fax:  [phone]  Name: Vickie Castillo MRN: [id number] Date of Birth: 1970/12/24

## 2015-05-01 NOTE — Patient Instructions (Signed)
With Support    Stand on one leg in neutral spine holding support. Hold _30___ seconds. Repeat on other leg. Do __3__ repetitions, __2 x/day  Forward    Facing step, place left leg on step, flexed at hip. Step up slowly, bringing hips in line with knee and shoulder. Stay up tall(not like picture)  Bring other foot onto step. Reverse process to step back down. Do _10__ repetitions, _2-3___ sets.  1-2 x/day     Copyright  [employer]. All rights reserved.   Trunk Flexion    Standing on one leg, bend forward from hips. Touch chair seat, then return, keeping back straight and leg bent. Hold each position __3-5__ seconds. Repeat on other leg. Do __10__ repetitions, _2-3___ sets. 1-2 x/day

## 2015-05-02 ENCOUNTER — Telehealth: Payer: Self-pay | Admitting: Sports Medicine

## 2015-05-02 NOTE — Telephone Encounter (Signed)
Received fax for prior authorization on [employer] sent through cover my meds and received approval from 04/02/15 - 08/30/15. Case ID [id number]. - CF

## 2015-05-08 ENCOUNTER — Encounter: Payer: BC Managed Care – PPO | Admitting: Rehabilitative and Restorative Service Providers"

## 2015-05-10 ENCOUNTER — Other Ambulatory Visit: Payer: Self-pay | Admitting: Sports Medicine

## 2015-05-13 ENCOUNTER — Encounter: Payer: Self-pay | Admitting: Rehabilitative and Restorative Service Providers"

## 2015-05-13 ENCOUNTER — Ambulatory Visit (INDEPENDENT_AMBULATORY_CARE_PROVIDER_SITE_OTHER): Payer: BC Managed Care – PPO | Admitting: Rehabilitative and Restorative Service Providers"

## 2015-05-13 DIAGNOSIS — M25562 Pain in left knee: Secondary | ICD-10-CM

## 2015-05-13 DIAGNOSIS — M25662 Stiffness of left knee, not elsewhere classified: Secondary | ICD-10-CM | POA: Diagnosis not present

## 2015-05-13 DIAGNOSIS — R29898 Other symptoms and signs involving the musculoskeletal system: Secondary | ICD-10-CM | POA: Diagnosis not present

## 2015-05-13 NOTE — Therapy (Addendum)
[place], [address] Phone: [phone]   Fax:  [phone]  Physical Therapy Treatment  Patient Details  Name: Vickie Castillo MRN: [id number] Date of Birth: [birth year] Referring Provider: Dr. Frederik Pear   Encounter Date: 05/13/2015      PT End of Session - 05/13/15 1653    Visit Number 6   Number of Visits 12   Date for PT Re-Evaluation 05/24/15   PT Start Time 9179   PT Stop Time 1654   PT Time Calculation (min) 58 min   Activity Tolerance Patient tolerated treatment well      Past Medical History  Diagnosis Date  . Seasonal allergies   . Fibroids   . Obesity     Past Surgical History  Procedure Laterality Date  . Myomectomy    . Abdominal hysterectomy    . Cesarean section      There were no vitals filed for this visit.  Visit Diagnosis:  Knee pain, acute, left  Weakness of left leg  Stiffness of knee joint, left      Subjective Assessment - 05/13/15 1617    Subjective Very busy - some pain on and off.    Currently in Pain? Yes   Pain Score 1             [facility] PT Assessment - 05/13/15 0001    Assessment   Medical Diagnosis Lt knee arthroscopic surgery/torn meniscus and cartilage   Referring Provider Dr. Frederik Pear    Onset Date/Surgical Date 04/04/05   Hand Dominance Right   Next MD Visit 05/14/15   AROM   Left Knee Extension 0   Left Knee Flexion 114   Strength   Right Hip Extension 5/5   Right Hip ABduction 5/5   Left Hip Extension 5/5   Left Hip ABduction 5/5   Left Knee Flexion 5/5   Left Knee Extension 5/5                     [facility] Adult PT Treatment/Exercise - 05/13/15 0001    Lumbar Exercises: Stretches   Passive Hamstring Stretch 3 reps;30 seconds   Passive Hamstring Stretch Limitations supine opposite knee bent w/ strap   Quad Stretch --   Lumbar Exercises: Supine   AB Set Limitations 3 part core 10 sec x 10 reps   Heel Slides 5 reps   Bridge --   Other Supine Lumbar Exercises adductor squeeze small ball x10   Knee/Hip Exercises: Stretches   Sports administrator 3 reps;30 seconds   Knee/Hip Exercises: Aerobic   Nustep L3 x 7 min    Knee/Hip Exercises: Standing   Lateral Step Up Left;10 reps;Hand Hold: 1;Step Height: 4";2 sets   Forward Step Up Left;10 reps;Hand Hold: 1;Step Height: 4";2 sets   SLS 30 sec x3 reps hand hold assist Lt    SLS with Vectors SLS fwd touch Lt LT x10   on blue foam cushion    Knee/Hip Exercises: Supine   Quad Sets Strengthening;Left;10 reps   Heel Slides Left;AROM;5 reps   Hip Adduction Isometric Both;10 reps  5 sec hold    Bridges with Ball Squeeze Both;2 sets;10 reps  3 sec hold    Straight Leg Raises Strengthening;Left;Right;1 set;10 reps   Straight Leg Raise with External Rotation Strengthening;Left;1 set;10 reps   Cryotherapy   Number Minutes Cryotherapy 15 Minutes   Cryotherapy Location Lumbar Spine   Type of Cryotherapy Ice  pack   Electrical Stimulation   Electrical Stimulation Location Rt SI/ piriformis; Lt knee   Electrical Stimulation Action ion repelling    Electrical Stimulation Parameters to tolerance    Electrical Stimulation Goals Pain   Vasopneumatic   Number Minutes Vasopneumatic  15 minutes   Vasopnuematic Location  Knee   Vasopneumatic Pressure Medium   Vasopneumatic Temperature  *3                PT Education - 05/13/15 1652    Education provided Yes   Education Details HEP encouraged consistency    Person(s) Educated Patient   Methods Explanation;Demonstration;Tactile cues;Verbal cues;Handout   Comprehension Verbalized understanding;Returned demonstration;Verbal cues required;Tactile cues required          PT Short Term Goals - 05/13/15 1657    PT SHORT TERM GOAL #3   Title Patient to tolerate 45 min sitting; 15 min standing; 15 min walking 05/10/15   Baseline improving functional activity tolerance   Time 3   Period Weeks    Status Achieved           PT Long Term Goals - 05/13/15 1658    PT LONG TERM GOAL #1   Title Patient I in HEP for discharge 05/24/15   Time 6   Period Weeks   Status On-going   PT LONG TERM GOAL #2   Title 5-/5 to 5/5 strength Rt/Lt  LE 05/24/15   Time 6   Period Weeks   Status Achieved   PT LONG TERM GOAL #3   Title Pain decreased to 0/10 to 2/10 90% of day 04/3015   Time 6   Period Weeks   Status On-going   PT LONG TERM GOAL #4   Title Decrease FOTO to </= 40% limitatiion 05/24/15   Time 6   Period Weeks   Status On-going               Plan - 05/13/15 1654    Clinical Impression Statement Difficulty making time for HEP and has missed some PT appts due to family conflicts. [name] also has episodic flare ups of pain related to her activity level.  Progressing with rehab with good ROM and strength. Needs to move toward higher level strengthening and functional strengthening activites.    Pt will benefit from skilled therapeutic intervention in order to improve on the following deficits Pain;Decreased range of motion;Decreased strength;Abnormal gait;Improper body mechanics;Decreased activity tolerance   Rehab Potential Good   PT Frequency 2x / week   PT Duration 6 weeks   PT Treatment/Interventions Patient/family education;ADLs/Self Care Home Management;Therapeutic exercise;Therapeutic activities;Manual techniques;Cryotherapy;Electrical Stimulation;Moist Heat;Ultrasound;Traction;Neuromuscular re-education;Dry needling   PT Next Visit Plan progress knee rehab working on ROM; strength; stability Lt LE as tolerated; continue core stabilization and strengthening for lumbar disc    PT Home Exercise Plan HEP Lt knee for ROM and strength; spine care; 3 part core; stretching; extension program; stabilization    Consulted and Agree with Plan of Care Patient        Problem List Patient Active Problem List   Diagnosis Date Noted  . Right lumbar radiculopathy 01/14/2015  .  Primary osteoarthritis of both knees 01/14/2015  . Obesity     Regnia Mathwig Nilda Simmer  PT, MPH   05/13/2015, 4:59 PM  [facility] [place] [place], [place], [address] Phone: [phone]   Fax:  [phone]  Name: Vickie Castillo MRN: [id number] Date of Birth: [birth year]    PHYSICAL  THERAPY DISCHARGE SUMMARY  Visits from Start of Care: 6  Current functional level related to goals / functional outcomes: unknown   Remaining deficits: unknown   Education / Equipment: HEP  Plan:                                                    Patient goals were partially met. Patient is being discharged due to                                                    Patient has been out of town, not sure when she will return.  ?????   Jeral Pinch, PT 06/13/2015 11:26 AM

## 2015-05-13 NOTE — Patient Instructions (Signed)
Lateral Step Up    Stand to side of step. Step up leading with left leg. Slowly touch right heel down and return to standing on left  Perform _10__ reps.

## 2015-05-15 ENCOUNTER — Encounter: Payer: BC Managed Care – PPO | Admitting: Rehabilitative and Restorative Service Providers"

## 2015-05-22 ENCOUNTER — Other Ambulatory Visit: Payer: Self-pay | Admitting: Sports Medicine

## 2015-05-22 ENCOUNTER — Encounter: Payer: BC Managed Care – PPO | Admitting: Physical Therapy

## 2015-05-22 ENCOUNTER — Telehealth: Payer: Self-pay

## 2015-05-22 DIAGNOSIS — M5416 Radiculopathy, lumbar region: Secondary | ICD-10-CM

## 2015-05-22 NOTE — Telephone Encounter (Signed)
No problem at all, ordering a repeat right L4-L5 and L5-S1 transforaminal epidurals.  Routing to [place], to contact [facility] for scheduling.

## 2015-05-22 NOTE — Telephone Encounter (Signed)
Patient called regarding her back pain.  She is having the same problems as before.  She was wondering if she could be referred back to have another shot.  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

## 2015-05-22 NOTE — Telephone Encounter (Signed)
Information called in to [place].

## 2015-05-29 ENCOUNTER — Encounter: Payer: BC Managed Care – PPO | Admitting: Rehabilitative and Restorative Service Providers"

## 2015-06-10 ENCOUNTER — Ambulatory Visit
Admission: RE | Admit: 2015-06-10 | Discharge: 2015-06-10 | Disposition: A | Discharge status: Home / Self Care | Payer: BC Managed Care – PPO | Source: Ambulatory Visit | Attending: Sports Medicine | Admitting: Sports Medicine

## 2015-06-10 DIAGNOSIS — M5416 Radiculopathy, lumbar region: Secondary | ICD-10-CM

## 2015-06-10 MED ORDER — METHYLPREDNISOLONE ACETATE 40 MG/ML INJ SUSP (RADIOLOG
120.0000 mg | Freq: Once | INTRAMUSCULAR | Status: AC
  Administered 2015-06-10: 120 mg via EPIDURAL

## 2015-06-10 MED ORDER — IOHEXOL 180 MG/ML  SOLN
1.0000 mL | Freq: Once | INTRAMUSCULAR | Status: AC | PRN
  Administered 2015-06-10: 1 mL via EPIDURAL

## 2015-06-10 NOTE — Discharge Instructions (Signed)

## 2015-06-12 ENCOUNTER — Other Ambulatory Visit: Payer: Self-pay | Admitting: Sports Medicine

## 2015-07-08 ENCOUNTER — Ambulatory Visit: Payer: BC Managed Care – PPO | Admitting: Osteopathic Medicine

## 2015-07-15 ENCOUNTER — Ambulatory Visit: Payer: BC Managed Care – PPO | Admitting: Osteopathic Medicine

## 2015-07-18 ENCOUNTER — Ambulatory Visit: Payer: BC Managed Care – PPO | Admitting: Osteopathic Medicine

## 2015-07-25 ENCOUNTER — Ambulatory Visit (INDEPENDENT_AMBULATORY_CARE_PROVIDER_SITE_OTHER): Payer: BC Managed Care – PPO | Admitting: Osteopathic Medicine

## 2015-07-25 ENCOUNTER — Encounter: Payer: Self-pay | Admitting: Osteopathic Medicine

## 2015-07-25 VITALS — BP 131/65 | HR 67 | Ht 63.0 in | Wt 249.0 lb

## 2015-07-25 DIAGNOSIS — E669 Obesity, unspecified: Secondary | ICD-10-CM

## 2015-07-25 DIAGNOSIS — Z79899 Other long term (current) drug therapy: Secondary | ICD-10-CM

## 2015-07-25 DIAGNOSIS — E559 Vitamin D deficiency, unspecified: Secondary | ICD-10-CM

## 2015-07-25 MED ORDER — LIRAGLUTIDE -WEIGHT MANAGEMENT 18 MG/3ML ~~LOC~~ SOPN: 3.0000 mg | PEN_INJECTOR | Freq: Every day | SUBCUTANEOUS | Status: DC

## 2015-07-25 MED ORDER — PHENTERMINE HCL 37.5 MG PO TABS: ORAL_TABLET | ORAL | Status: DC

## 2015-07-25 NOTE — Progress Notes (Signed)
HPI: DESHAWNDA Castillo is a 45 y.o. female who presents to [place] today for chief complaint of:  Chief Complaint  Patient presents with  . Establish Care  . Weight Loss    WEIGHT ISSUES Phentermine in 03/2015 - Never did this Saxenda started 03/2015 - Never did this  Went to weight loss seminar at [employer], considering bariatric surgery but would like to try medications and lifestyle changes first Logs food in app inconsistently Emotional eater  Weight Watchers a long time ago Hcg shots maybe 8 years ago which worked but weight came back on Trying to be more conscious of what she's eating  Challenges: family eating habits, two teenagers at home and 2 other kids in college, not much time to plan/prepare meals, rarely has breakfast.   Past medical, social and family history reviewed: Past Medical History  Diagnosis Date  . Seasonal allergies   . Fibroids   . Obesity    Past Surgical History  Procedure Laterality Date  . Myomectomy    . Abdominal hysterectomy    . Cesarean section     Social History  Substance Use Topics  . Smoking status: Never Smoker   . Smokeless tobacco: Not on file  . Alcohol Use: No   Family History  Problem Relation Age of Onset  . Hyperlipidemia Mother   . Hypertension Mother   . Cancer Father 52    non hodgkins lymphoma/testicular  . Cancer Paternal Grandmother     unknown  . Heart disease Brother 60    Current Outpatient Prescriptions  Medication Sig Dispense Refill  . Cholecalciferol (VITAMIN D PO) Take by mouth.    . cyclobenzaprine (FLEXERIL) 10 MG tablet TAKE 1/2 TABLET BY MOUTH DAILY AT BEDTIME THEN INCREASE GRADUALLY TO ONE TABLET 3 TIMES DAILY 30 tablet 0  . HYDROcodone-acetaminophen (NORCO/VICODIN) 5-325 MG tablet Take 1 tablet by mouth every 8 (eight) hours as needed for moderate pain. 40 tablet 0  . Liraglutide -Weight Management (SAXENDA) 18 MG/3ML SOPN Inject 3 mg into the skin daily. 0.6  mg inj subcut daily for 1 week, then incr by 0.6 mg weekly until reaching 3 mg injected subcut daily 5 pen 0  . meloxicam (MOBIC) 15 MG tablet TAKE 1 TABLET BY MOUTH EVERY MORNING WITH BREAKFAST FOR 2 WEEKS THEN DAILY AS NEEDED FOR PAIN 30 tablet 0  . ondansetron (ZOFRAN-ODT) 8 MG disintegrating tablet Take 1 tablet (8 mg total) by mouth every 8 (eight) hours as needed for nausea. 20 tablet 3  . phentermine (ADIPEX-P) 37.5 MG tablet One tab by mouth qAM 30 tablet 0   No current facility-administered medications for this visit.   Allergies  Allergen Reactions  . Percocet [Oxycodone-Acetaminophen] Other (See Comments)    "makes me feel high, like I am floating" per pt      Review of Systems: CONSTITUTIONAL:  No  fever, no chills, No  unintentional weight changes HEAD/EYES/EARS/NOSE/THROAT: (+0 occasional headache, no vision change, no hearing change, No  sore throat, No  sinus pressure, (+) allergic rhinitis CARDIAC: No chest pain, No  pressure, No palpitations, No  orthopnea RESPIRATORY: No  cough, No  shortness of breath/wheeze GASTROINTESTINAL: No  nausea, No  vomiting, No  abdominal pain, No  blood in stool, No  diarrhea, No  constipation  MUSCULOSKELETAL: No  myalgia/arthralgia GENITOURINARY: (+) incontinence, No  abnormal genital bleeding/discharge, (+) night urination SKIN: No  rash/wounds/concerning lesions HEM/ONC: No  easy bruising/bleeding, No  abnormal lymph  node ENDOCRINE: No polyuria/polydipsia/polyphagia, No  heat/cold intolerance  NEUROLOGIC: No  weakness, No  dizziness, No  slurred speech PSYCHIATRIC: No  concerns with depression, No  concerns with anxiety, No sleep problems  Exam:  BP 131/65 mmHg  Pulse 67  Ht 5\' 3"  (1.6 m)  Wt 249 lb (112.946 kg)  BMI 44.12 kg/m2 Constitutional: VS see above. General Appearance: alert, well-developed, well-nourished, NAD Eyes: Normal lids and conjunctive, non-icteric sclera,  Neck: No masses, trachea midline. No thyroid  enlargement/tenderness/mass appreciated. No lymphadenopathy Respiratory: Normal respiratory effort. no wheeze, no rhonchi, no rales Cardiovascular: S1/S2 normal, no murmur, no rub/gallop auscultated. RRR.  Psychiatric: Normal judgment/insight. Normal mood and affect. Oriented x3.    No results found for this or any previous visit (from the past 72 hour(s)).    ASSESSMENT/PLAN: Extensive counseling on calorie counting, reduce sugars and fats. Had previously been Rx Saxenda and Phentermine, she called pharmacy and these meds are stlil there, will pick them up today and plan to follow up in 4 weeks to review labs/annual wellness exam and follow up on weight/BP. ER/RTC precautions reviewed.   Obesity - Plan: Liraglutide -Weight Management (SAXENDA) 18 MG/3ML SOPN, phentermine (ADIPEX-P) 37.5 MG tablet, Lipid panel, TSH  Medication management - Plan: CBC with Differential/Platelet, COMPLETE METABOLIC PANEL WITH GFR  Vitamin D deficiency - Plan: VITAMIN D 25 Hydroxy (Vit-D Deficiency, Fractures)   Return in about 4 weeks (around 08/22/2015) for [place] BP FOLLOW PU. Marland Kitchen

## 2015-07-25 NOTE — Patient Instructions (Signed)
Let's plan to follow up in 4 weeks after starting the Phentermine for weight and blood pressure check, we can also devote time to gong over lab results in detail and making sure you're caught up on other preventive care measures.   We have given you lab orders - come back and get these done fasting so we have accurate cholestenol and fasting sugar to screen for diabetes.   Plan to come to the office for a nurse to show you how to administer the Saxenda injections.   If you'd like more information on bariatric surgery referral, let me know.   Be diligent about logging calories, portion control, decrease sugars (bread and pasta, juice and sodas, desserts) and fats (red meats, oils/butter, cheese). As much lean meats, nuts/seeds, beans, vegetables, fruits, egg whites as you want! Treat yourself to one "cheat" meal per week if you're doing well with your diet. Breakfast is key!   Any questions, let me know. See you in a month!

## 2015-07-26 ENCOUNTER — Other Ambulatory Visit (HOSPITAL_COMMUNITY): Payer: Self-pay | Admitting: Obstetrics & Gynecology

## 2015-07-26 ENCOUNTER — Ambulatory Visit (INDEPENDENT_AMBULATORY_CARE_PROVIDER_SITE_OTHER): Payer: BC Managed Care – PPO | Admitting: Sports Medicine

## 2015-07-26 ENCOUNTER — Other Ambulatory Visit: Payer: Self-pay | Admitting: *Deleted

## 2015-07-26 VITALS — BP 131/65 | HR 67

## 2015-07-26 DIAGNOSIS — E669 Obesity, unspecified: Secondary | ICD-10-CM | POA: Diagnosis not present

## 2015-07-26 DIAGNOSIS — Z139 Encounter for screening, unspecified: Secondary | ICD-10-CM

## 2015-07-26 LAB — CBC WITH DIFFERENTIAL/PLATELET
BASOS ABS: 0.1 10*3/uL (ref 0.0–0.1)
BASOS PCT: 1 % (ref 0–1)
Eosinophils Absolute: 0.2 10*3/uL (ref 0.0–0.7)
Eosinophils Relative: 2 % (ref 0–5)
HCT: 35.9 % — ABNORMAL LOW (ref 36.0–46.0)
HEMOGLOBIN: 11.9 g/dL — AB (ref 12.0–15.0)
Lymphocytes Relative: 26 % (ref 12–46)
Lymphs Abs: 2.1 10*3/uL (ref 0.7–4.0)
MCH: 28.1 pg (ref 26.0–34.0)
MCHC: 33.1 g/dL (ref 30.0–36.0)
MCV: 84.7 fL (ref 78.0–100.0)
MONOS PCT: 9 % (ref 3–12)
MPV: 9 fL (ref 8.6–12.4)
Monocytes Absolute: 0.7 10*3/uL (ref 0.1–1.0)
NEUTROS ABS: 4.9 10*3/uL (ref 1.7–7.7)
NEUTROS PCT: 62 % (ref 43–77)
PLATELETS: 341 10*3/uL (ref 150–400)
RBC: 4.24 MIL/uL (ref 3.87–5.11)
RDW: 13 % (ref 11.5–15.5)
WBC: 7.9 10*3/uL (ref 4.0–10.5)

## 2015-07-26 LAB — COMPLETE METABOLIC PANEL WITH GFR
ALBUMIN: 3.7 g/dL (ref 3.6–5.1)
ALK PHOS: 79 U/L (ref 33–115)
ALT: 10 U/L (ref 6–29)
AST: 13 U/L (ref 10–35)
BILIRUBIN TOTAL: 0.4 mg/dL (ref 0.2–1.2)
BUN: 9 mg/dL (ref 7–25)
CO2: 27 mmol/L (ref 20–31)
CREATININE: 0.7 mg/dL (ref 0.50–1.10)
Calcium: 8.7 mg/dL (ref 8.6–10.2)
Chloride: 104 mmol/L (ref 98–110)
GFR, Est African American: >89 mL/min (ref >=60)
GLUCOSE: 91 mg/dL (ref 65–99)
Potassium: 3.8 mmol/L (ref 3.5–5.3)
SODIUM: 139 mmol/L (ref 135–146)
TOTAL PROTEIN: 6.9 g/dL (ref 6.1–8.1)

## 2015-07-26 LAB — LIPID PANEL
Cholesterol: 139 mg/dL (ref 125–200)
HDL: 64 mg/dL (ref >=46)
LDL CALC: 65 mg/dL (ref <130)
Total CHOL/HDL Ratio: 2.2 Ratio (ref <=5.0)
Triglycerides: 51 mg/dL (ref <150)
VLDL: 10 mg/dL (ref <30)

## 2015-07-26 LAB — TSH: TSH: 1.86 m[IU]/L

## 2015-07-26 MED ORDER — AMBULATORY NON FORMULARY MEDICATION: Status: DC

## 2015-07-26 NOTE — Progress Notes (Signed)
   Subjective:    Patient ID: Vickie [place], female    DOB: 03-06-1971, 45 y.o.   MRN: [id number]  HPI    Pt is here today for saxenda education. The patient was educated about how to administer saxenda injection as well as appropriate injection sites. The patient was instructed to keep unused pen needles in refrigerator. Discussed common side effect of nausea and discussed the importance of administering the medication daily at the same time each day. Made sure she understood how to titrate the dose up after completing the recomended dose for the week. Advised if she had any questions or concerns to please call the office back. The patient voiced understanding   Review of Systems     Objective:   Physical Exam        Assessment & Plan:  The patient declined to administer herself the injection today. She wanted to wait until Monday to start the medication. A prescription was sent to her pharmacy for pen needles. Advised to make sure the pharmacy dispensed the correct size needle according to prescription.

## 2015-07-27 LAB — VITAMIN D 25 HYDROXY (VIT D DEFICIENCY, FRACTURES): VIT D 25 HYDROXY: 28 ng/mL — AB (ref 30–100)

## 2015-07-31 ENCOUNTER — Ambulatory Visit: Payer: BC Managed Care – PPO

## 2015-07-31 ENCOUNTER — Telehealth: Payer: Self-pay

## 2015-07-31 NOTE — Telephone Encounter (Signed)
Patient has been informed. She stated that she is taking the medication in the morning but she will try taking 1/2 and work her way to a full tablet. Rhonda Cunningham,CMA

## 2015-07-31 NOTE — Telephone Encounter (Signed)
Patient called stated that she is taking Saxenda and Phentermine and she has not been able to sleep at night. She stated that it keeps her up. Please advise patient on what she should do. Rhonda Cunningham,CMA

## 2015-07-31 NOTE — Telephone Encounter (Signed)
Make sure taking Phentermine in the morning. She can cut the dose of this medicine in half and see if that helps.

## 2015-08-12 ENCOUNTER — Encounter: Payer: Self-pay | Admitting: Osteopathic Medicine

## 2015-08-12 DIAGNOSIS — G43109 Migraine with aura, not intractable, without status migrainosus: Secondary | ICD-10-CM

## 2015-08-12 DIAGNOSIS — H35039 Hypertensive retinopathy, unspecified eye: Secondary | ICD-10-CM

## 2015-08-12 HISTORY — DX: Migraine with aura, not intractable, without status migrainosus: G43.109

## 2015-08-12 HISTORY — DX: Hypertensive retinopathy, unspecified eye: H35.039

## 2015-08-19 ENCOUNTER — Encounter: Payer: Self-pay | Admitting: Obstetrics & Gynecology

## 2015-08-19 ENCOUNTER — Ambulatory Visit (INDEPENDENT_AMBULATORY_CARE_PROVIDER_SITE_OTHER): Payer: BC Managed Care – PPO | Admitting: Obstetrics & Gynecology

## 2015-08-19 ENCOUNTER — Telehealth: Payer: Self-pay

## 2015-08-19 VITALS — BP 112/78 | HR 76 | Resp 16 | Ht 63.5 in | Wt 238.0 lb

## 2015-08-19 DIAGNOSIS — Z01419 Encounter for gynecological examination (general) (routine) without abnormal findings: Secondary | ICD-10-CM | POA: Diagnosis not present

## 2015-08-19 DIAGNOSIS — Z90710 Acquired absence of both cervix and uterus: Secondary | ICD-10-CM | POA: Diagnosis not present

## 2015-08-19 NOTE — Progress Notes (Signed)
Subjective:    Vickie Cast[place] is a 45 y.o. M AA P4 (45, 32, 73 sons and a 43 yo daughter)  female who presents for an annual exam. The patient has no complaints today. The patient is sexually active. GYN screening history: last pap: was normal. The patient wears seatbelts: yes. The patient participates in regular exercise: yes. Has the patient ever been transfused or tattooed?: no. The patient reports that there is not domestic violence in her life.   Menstrual History: OB History    Gravida Para Term Preterm AB TAB SAB Ectopic Multiple Living   5 4 4  1  1   4       Menarche age: 89 No LMP recorded. Patient has had a hysterectomy.    The following portions of the patient's history were reviewed and updated as appropriate: allergies, current medications, past family history, past medical history, past social history, past surgical history and problem list.  Review of Systems Pertinent items noted in HPI and remainder of comprehensive ROS otherwise negative.    Objective:    BP 112/78 mmHg  Pulse 76  Resp 16  Ht 5' 3.5" (1.613 m)  Wt 238 lb (107.956 kg)  BMI 41.49 kg/m2  General Appearance:    Alert, cooperative, no distress, appears stated age  Head:    Normocephalic, without obvious abnormality, atraumatic  Eyes:    PERRL, conjunctiva/corneas clear, EOM's intact, fundi    benign, both eyes  Ears:    Normal TM's and external ear canals, both ears  Nose:   Nares normal, septum midline, mucosa normal, no drainage    or sinus tenderness  Throat:   Lips, mucosa, and tongue normal; teeth and gums normal  Neck:   Supple, symmetrical, trachea midline, no adenopathy;    thyroid:  no enlargement/tenderness/nodules; no carotid   bruit or JVD  Back:     Symmetric, no curvature, ROM normal, no CVA tenderness  Lungs:     Clear to auscultation bilaterally, respirations unlabored  Chest Wall:    No tenderness or deformity   Heart:    Regular rate and rhythm, S1 and S2 normal, no murmur, rub    or gallop  Breast Exam:    No tenderness, masses, or nipple abnormality  Abdomen:     Soft, non-tender, bowel sounds active all four quadrants,    no masses, no organomegaly  Genitalia:    Normal female without lesion, discharge or tenderness, normal cuff and bimanual exam     Extremities:   Extremities normal, atraumatic, no cyanosis or edema  Pulses:   2+ and symmetric all extremities  Skin:   Skin color, texture, turgor normal, no rashes or lesions  Lymph nodes:   Cervical, supraclavicular, and axillary nodes normal  Neurologic:   CNII-XII intact, normal strength, sensation and reflexes    throughout  .    Assessment:    Healthy female exam.    Plan:     Breast self exam technique reviewed and patient encouraged to perform self-exam monthly. Mammogram. scheduled

## 2015-08-19 NOTE — Telephone Encounter (Signed)
Vickie Cast[place] stopped taking the Saxenda because she started getting nauseated and had diarrhea on Thursday. She is wanting to know what Dr Sheppard Coil wants her to do. Restart medication or stop all together. Please advise.

## 2015-08-19 NOTE — Telephone Encounter (Signed)
Nausea and diarrhea are common side effects of this medicine, they're typically worse if the patient is over eating, they will usually resolve with continuation of the medicine. If patient wants to try backing off on the dose and see if she tolerates a lower dose better, would probably recommend trying that first. If side effects are intolerable, we can stop the medication and discuss alternatives.

## 2015-08-20 NOTE — Telephone Encounter (Signed)
Left a message advising of recommendations.  

## 2015-08-22 ENCOUNTER — Encounter: Payer: Self-pay | Admitting: Osteopathic Medicine

## 2015-08-22 ENCOUNTER — Ambulatory Visit (INDEPENDENT_AMBULATORY_CARE_PROVIDER_SITE_OTHER): Payer: BC Managed Care – PPO | Admitting: Osteopathic Medicine

## 2015-08-22 VITALS — BP 129/81 | HR 90 | Ht 63.5 in | Wt 240.0 lb

## 2015-08-22 DIAGNOSIS — E669 Obesity, unspecified: Secondary | ICD-10-CM

## 2015-08-22 DIAGNOSIS — Z23 Encounter for immunization: Secondary | ICD-10-CM

## 2015-08-22 DIAGNOSIS — E559 Vitamin D deficiency, unspecified: Secondary | ICD-10-CM

## 2015-08-22 DIAGNOSIS — Z79899 Other long term (current) drug therapy: Secondary | ICD-10-CM

## 2015-08-22 DIAGNOSIS — Z0000 Encounter for general adult medical examination without abnormal findings: Secondary | ICD-10-CM

## 2015-08-22 NOTE — Addendum Note (Signed)
Addended by: Doree Albee on: 08/22/2015 04:01 PM   Modules accepted: Orders

## 2015-08-22 NOTE — Progress Notes (Signed)
HPI: Vickie Castillo is a 45 y.o. female who presents to [facility] today for chief complaint of:  Chief Complaint  Patient presents with  . Annual Exam    Annual wellness exam today- see below for preventive care  Discussed side effects of weight loss medications, see previous telephone notes, pt has reduced Saxenda dose and doing better, plan to try titrate up again. Phentermine better on lower dose but still some dry mouth effects.   Past medical, social and family history reviewed: Past Medical History  Diagnosis Date  . Seasonal allergies   . Fibroids   . Obesity   . 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 Hypertensive retinopathy 08/12/2015    Per optometry records, see scanned docs   Past Surgical History  Procedure Laterality Date  . Myomectomy    . Cesarean section    . Knee surgery Left 04/05/2015  . Abdominal hysterectomy      PARTIAL   Social History  Substance Use Topics  . Smoking status: Never Smoker   . Smokeless tobacco: Not on file  . Alcohol Use: No   Family History  Problem Relation Age of Onset  . Hyperlipidemia Mother   . Hypertension Mother   . Cancer Father 73    non hodgkins lymphoma/testicular  . Cancer Paternal Grandmother     unknown  . Heart disease Brother 17  . Diabetes Brother     Current Outpatient Prescriptions  Medication Sig Dispense Refill  . AMBULATORY NON FORMULARY MEDICATION BD ultra-fine pen needles 31g x18mm Use as directed 100 each 2  . cyclobenzaprine (FLEXERIL) 10 MG tablet TAKE 1/2 TABLET BY MOUTH DAILY AT BEDTIME THEN INCREASE GRADUALLY TO ONE TABLET 3 TIMES DAILY 30 tablet 0  . fexofenadine (ALLEGRA) 30 MG tablet Take 30 mg by mouth 2 (two) times daily.    . Liraglutide -Weight Management (SAXENDA) 18 MG/3ML SOPN Inject 3 mg into the skin daily. 0.6 mg inj subcut daily for 1 week, then incr by 0.6 mg weekly until reaching 3 mg injected subcut daily  5 pen 0  . meloxicam (MOBIC) 15 MG tablet TAKE 1 TABLET BY MOUTH EVERY MORNING WITH BREAKFAST FOR 2 WEEKS THEN DAILY AS NEEDED FOR PAIN 30 tablet 0  . phentermine (ADIPEX-P) 37.5 MG tablet One tab by mouth qAM 30 tablet 0   No current facility-administered medications for this visit.   Allergies  Allergen Reactions  . Percocet [Oxycodone-Acetaminophen] Other (See Comments)    "makes me feel high, like I am floating" per pt      Review of Systems: CONSTITUTIONAL:  No  fever, no chills, No  unintentional weight changes HEAD/EYES/EARS/NOSE/THROAT: No  headache, no vision change, no hearing change, No  sore throat, No  sinus pressure, (+) dry mouth CARDIAC: No  chest pain, No  pressure, No palpitations, No  orthopnea RESPIRATORY: No  cough, No  shortness of breath/wheeze GASTROINTESTINAL: (+) nausea, No  vomiting, No  abdominal pain, No  blood in stool, (+) intermittent diarrhea, No  constipation  MUSCULOSKELETAL: No  myalgia/arthralgia GENITOURINARY: No  incontinence, No  abnormal genital bleeding/discharge SKIN: No  rash/wounds/concerning lesions HEM/ONC: No  easy bruising/bleeding, No  abnormal lymph node ENDOCRINE: No polyuria/polydipsia/polyphagia, No  heat/cold intolerance  NEUROLOGIC: No  weakness, No  dizziness, No  slurred speech PSYCHIATRIC: No  concerns with depression, No  concerns with anxiety, (+) sleep problems better w/ reduced phentermine  Exam:  BP 129/81 mmHg  Pulse 90  Ht 5' 3.5" (1.613 m)  Wt 240 lb (108.863 kg)  BMI 41.84 kg/m2 Constitutional: VS see above. General Appearance: alert, well-developed, well-nourished, NAD Eyes: Normal lids and conjunctive, non-icteric sclera,  Ears, Nose, Mouth, Throat: MMM, Normal external inspection ears/nares/mouth/lips/gums, TM normal bilaterally. Pharynx no erythema, no exudate.  Neck: No masses, trachea midline. No thyroid enlargement/tenderness/mass appreciated. No lymphadenopathy Respiratory: Normal respiratory effort. no  wheeze, no rhonchi, no rales Cardiovascular: S1/S2 normal, no murmur, no rub/gallop auscultated. RRR. Pedal pulse II/IV bilaterall DP and PT. No lower extremity edema. Gastrointestinal: Nontender, no masses. No hepatomegaly, no splenomegaly. No hernia appreciated. Bowel sounds normal. Rectal exam deferred.  Musculoskeletal: Gait normal. No clubbing/cyanosis of digits.  Neurological: No cranial nerve deficit on limited exam. Motor and sensation intact and symmetric Skin: warm, dry, intact. No rash/ulcer. No concerning nevi or subq nodules on limited exam.   Psychiatric: Normal judgment/insight. Normal mood and affect. Oriented x3.    No results found for this or any previous visit (from the past 72 hour(s)).    ASSESSMENT/PLAN:  GYN exam already, no Pap needed, scheduled for Mammo, caught up on lipids and DM screening, no FH colon cancer, nonsmoker, Tetanus vax today.   Advised on side effects of Saxenda and Phentermine, having some dry mouth problems and occasional nausea/diarrhea. Still titrating up on Saxenda, on 1/2 tab Phentermine, has lost 10 lbs, BP ok.   Annual physical exam  Need for tetanus booster  Obesity  Medication management  Vitamin D deficiency   FEMALE PREVENTIVE CARE  ANNUAL SCREENING/COUNSELING Tobacco - Never  Alcohol - social drinker Diet/Exercise - HEALTHY HABITS DISCUSSED TO DECREASE CV RISK - on weight loss medicines, hasn't started working out.  Sexual Health - Yes with female. STI - The patient denies history of sexually transmitted disease. INTERESTED IN STI TESTING - no Depression - PQH2 Negative Domestic violence concerns - no HTN SCREENING - SEE VITALS Vaccination status - SEE BELOW  INFECTIOUS DISEASE SCREENING HIV - all adults 15-65 - does not need GC/CT - sexually active - does not need HepC - born 35-1966 - does not need TB - does not need  DISEASE SCREENING Lipid  - does not need DM2 - does not need Osteoporosis - does not  need  CANCER SCREENING Cervical - does not need Breast - needs - has on scheduled Lung - does not need Colon - does not need  ADULT VACCINATION Influenza - annual - already has Td booster every 10 years - was given HPV - age <54yo - was not indicated Zoster - age 87+ - was not indicated Pneumonia - age 42+ sooner if risk (DM, smoker, other) - was not indicated  OTHER Fall - exercise and Vit D age 43+ - does not need Consider ASA - age 25-59 - does not need  Return in about 4 weeks (around 09/19/2015), or sooner if needed, for NURSE VISIT - WEIGHT AND BP CHECK FOR REFILLS SAXENDA AND PHENTERMINE (6 mos Dr. visit, 1 yr annual).

## 2015-08-26 ENCOUNTER — Telehealth: Payer: Self-pay

## 2015-08-26 NOTE — Telephone Encounter (Signed)
If she is interested in referral to discuss bariatrics/weight loss, I can put this in. Otherwise, would consider the oral medications we discussed at her previous visit, she can do some research on Contrave and Qsymia and see if these options are something she'd like to try. Otherwise, the option is to stay on the phentermine alone for 4 - 5 months and work on portion control and exercise and see how she does. She can get back to [place] with her decision, or we can put in referral.

## 2015-08-26 NOTE — Telephone Encounter (Signed)
Patient stated that she is no longer taking Saxenda 1.2 dose because it is making her nausea,  Real bad abdominal pain and diarrhea. She stated that she did not overeat or anything out the normal. Patient stated the she  Wants to know if there is something else you recommend that she take. She is still taking the Phentermine, she wants to know if weight loss surgery is an option? Please advise.Lawarence Meek,CMA

## 2015-08-26 NOTE — Telephone Encounter (Signed)
Patient stated that she will get back with [place] and let us know either way. Rhonda Cunningham,CMA

## 2015-08-26 NOTE — Telephone Encounter (Signed)
Noted, thanks!

## 2015-09-03 ENCOUNTER — Encounter: Payer: Self-pay | Admitting: Osteopathic Medicine

## 2015-09-03 ENCOUNTER — Ambulatory Visit (INDEPENDENT_AMBULATORY_CARE_PROVIDER_SITE_OTHER): Payer: BC Managed Care – PPO | Admitting: Osteopathic Medicine

## 2015-09-03 VITALS — BP 115/75 | HR 92 | Temp 98.3°F | Wt 236.0 lb

## 2015-09-03 DIAGNOSIS — H9202 Otalgia, left ear: Secondary | ICD-10-CM

## 2015-09-03 DIAGNOSIS — J302 Other seasonal allergic rhinitis: Secondary | ICD-10-CM

## 2015-09-03 DIAGNOSIS — E669 Obesity, unspecified: Secondary | ICD-10-CM

## 2015-09-03 DIAGNOSIS — H6982 Other specified disorders of Eustachian tube, left ear: Secondary | ICD-10-CM | POA: Diagnosis not present

## 2015-09-03 MED ORDER — PHENTERMINE HCL 37.5 MG PO TABS: 18.7500 mg | ORAL_TABLET | Freq: Every day | ORAL | Status: DC

## 2015-09-03 MED ORDER — IPRATROPIUM BROMIDE 0.03 % NA SOLN: 2.0000 | Freq: Three times a day (TID) | NASAL | Status: DC | PRN

## 2015-09-03 NOTE — Progress Notes (Signed)
HPI: Vickie Castillo is a 45 y.o. female who presents to [facility]  today for chief complaint of:  Chief Complaint  Patient presents with  . Ear Pain    left side of head hurts and left ear hurts   Ear pain . Location: L ear/head . Quality: soreness, sharp ear pain . Assoc signs/symptoms: see ROS . Duration: 1 days . Modifying factors: has tried the following OTC/Rx medications: none . Context: ?allergies  Obesity: Patient is still having nausea issues with this accident, she is tolerating the phentermine at half the dose when in type O was causing insomnia problems. Blood pressure was initially elevated today however on recheck is much improved, no chest pain/pressure/palpitations.   Past medical, social and family history reviewed. Current medications and allergies reviewed.     Review of Systems: CONSTITUTIONAL: no fever/chills, (+) fatigue HEAD/EYES/EARS/NOSE/THROAT: yes headache, no vision change or hearing change, no sore throat CARDIAC: No chest pain/pressure/palpitations RESPIRATORY: no cough, no shortness of breath GASTROINTESTINAL: no nausea, no vomiting, no abdominal pain, no diarrhea MUSCULOSKELETAL: no myalgia/arthralgia   Exam:  BP 115/75 mmHg  Pulse 92  Temp(Src) 98.3 F (36.8 C) (Oral)  Wt 236 lb (107.049 kg) Constitutional: VSS, see above. General Appearance: alert, well-developed, well-nourished, NAD Eyes: Normal lids and conjunctive, non-icteric sclera, PERRLA Ears, Nose, Mouth, Throat: Normal external inspection ears/nares/mouth/lips/gums, normal TM, MMM;       posterior pharynx without erythema, without exudate Neck: No masses, trachea midline. normal lymph nodes Respiratory: Normal respiratory effort. No  wheeze/rhonchi/rales Cardiovascular: S1/S2 normal, no murmur/rub/gallop auscultated. RRR. MSK: normal jaw motion, no TMJ tenderness   No results found for this or any previous visit (from the past 72  hour(s)). No results found.   ASSESSMENT/PLAN: Likely allergies, eustacian tube dysfunction, possible early viral prodrome. OTC medications reviewed for allergy control, first-line treatment typically steroid nasal spray, patient instructed on proper instillation of the spray. Can add Zyrtec or Allegra or other second-generation antihistamine. Prescription given for Atrovent if congestion persists/worsens. ER/RTC precautions reviewed. Okay to stop sex and if she would like but will even on lists for now until she is off of it entirely. Culture medication instructions in the system to reflect taking half phentermine daily  Acute ear pain, right  Eustachian tube dysfunction, right - Plan: ipratropium (ATROVENT) 0.03 % nasal spray  Seasonal allergies - Plan: ipratropium (ATROVENT) 0.03 % nasal spray  Obesity - Plan: phentermine (ADIPEX-P) 37.5 MG tablet    Return if symptoms worsen or fail to improve, and as directed previously for routine follow-up.

## 2015-09-03 NOTE — Patient Instructions (Signed)
DR. Jachelle Fluty'S HOME CARE INSTRUCTIONS: VIRAL ILLNESSES - ACHES/PAINS, SORE THROAT, SINUSITIS, COUGH, GASTRITIS   FRIST, A FEW NOTES ON OVER-THE-COUNTER MEDICATIONS!   USE CAUTION - MANY OVER-THE-COUNTER MEDICATIONS COME IN COMBINATIONS OF MULTIPLE GENERIC MEDICINES. FOR INSTANCE, NYQUIL HAS TYLENOL + COUGH MEDICINE + AN ANTIHISTAMINE, SO BE CAREFUL YOU'RE NOT TAKING A COMBINATION MEDICINE WHICH CONTAINS MEDICATIONS YOU'RE ALSO TAKING SEPARATELY (LIKE NYQUIL SYRUP PLUS TYLENOL PILLS).   YOUR PHARMACIST CAN HELP YOU AVOID MEDICATION INTERACTIONS AND DUPLICATIONS - ASK FOR THEIR HELP IF YOU ARE CONFUSED OR UNSURE ABOUT WHAT TO PURCHASE OVER-THE-COUNTER!   REMEMBER - IF YOU'RE EVER CONCERNED ABOUT MEDICATION SIDE EFFECTS, OR IF YOU'RE EVER CONCERNED YOUR SYMPTOMS ARE GETTING WORSE DESPITE TREATMENT, PLEASE CALL THE DOCTOR'S OFFICE! IF AFTER-HOURS, YOU CAN BE SEEN IN URGENT CARE OR, FOR SEVERE ILLNESS, PLEASE GO TO THE EMERGENCY ROOM.   TREAT SINUS CONGESTION, RUNNY NOSE & POSTNASAL DRIP:  Treat to increase airflow through sinuses, decrease congestion pain and prevent bacterial growth!   Remember, only 0.5-2% of sinus infections are due to a bacteria, the rest are due to a virus (usually the common cold) which will not get better with antibiotics!  NASAL SPRAYS: generally safe and should not interact with other medicines. Can take any of these medications, either alone or together... FLONASE (FLUTICASONE) - 2 sprays in each nostril twice per day (also a great allergy medicine to use long-term!) AFRIN (OXYMETOLAZONE) - use sparingly because it will cause rebound congestion, NEVER USE IN KIDS  SALINE NASAL SPRAY- no limit, but avoid use after other nasal sprays or it can wash the medicine away PRESCRTIPTION ATROVENT - as directed on prescription bottle  ANTIHISTAMINES: Helps dry out runny nose and decreases postnasal drip. Benadryl may cause drowsiness but other preparations should not be  as sedating. Certain kinds are not as safe in elderly individuals. OK to use unless Dr A says otherwise.  Only use one of the following... BENADRYL (DIPHENHYDRAMINE) - 25-50 mg every 6 hours ZYRTEC (CETIRIZINE) - 5-10 mg daily CLARITIN (LORATIDINE) - less potent. 10 mg daily ALLEGRA (FEXOFENADINE) - least likely to cause drowsiness! 180 mg daily or 60 mg twice per day  DECONGESTANTS: Helps dry out runny nose and helps with sinus pain. May cause insomnia, or sometimes elevated heart rate. Can cause problems if used often in people with high blood pressure. OK to use unless Dr A says otherwise. NEVER USE IN KIDS UNDER 2 YEARS OLD. Only use one of the following... SUDAFED (PSEUDOEPHEDINE) - 60 mg every 4 - 6 hours, also comes in 120 mg extended release every 12 hours, maximum 240 mg in 24 hours.  SUDAED PE (PHENYLEPHRINE) - 10 mg every 4 - 6 hours, maximum 60 mg per day  COMBINATIONS OF ABOVE ANTIHISTAMINES + DECONGESTANTS: these usually require you to show your ID at the pharmacy counter. You can also purchase these medicines separately as noted above.  Only use one of the following... ZYRTEC-D (CETIRIZINE + PSEUDOEPHEDRINE) - 12 hour formulation as directed CLARITIN-D (LORATIDINE + PSEUDOEPHEDRINE) - 12 and 24 hour formulations as directed ALLEGRA-D (FEXOFENADINE + PSEUDOEPHEDRINE) - 12 and 24 hour formulations as directed  PRESCRIPTION TREATMENT FOR SINUS PROBLEMS: Can include nasal sprays, pills, or antibiotics in the case of true bacterial infection. Not everyone needs an antibiotic but there are other medicines which will help you feel better while your body fights the infection!    TREAT COUGH & SORE THROAT:  Remember, cough is the body's way of protecting your   airways and lungs, it's a hard-wired reflex that is tough for medicines to treat!   Irritation to the airways will cause cough. This irritation is usually caused by upper airway problems like postnasal drip (treat as above)  and viral sore throat, but in severe cases can be due to lower airway problems like bronchitis or pneumonia, which a doctor can usually hear on exam of your lungs or see on an X-ray.  Sore throat is almost always due to a virus, but occasionally caused by certain strains of Strep, which requires antibiotics.   Exercise and smoking may make cough worse - take it easy while you're sick, and QUIT SMOKING!   Cough due to viral infection can linger for 2-3 weeks or so. If you're coughing longer than you think you should, or if the cough is severe, please make an appointment in the office - you may need a chest X-ray.  EXPECTORANT: Used to help clear airways, take these with PLENTY of water to help thin mucus secretions and make the mucus easier to cough up  Only use one of the following... ROBITUSSIN (DEXTROMETHORPHAN OR GUAIFENISEN depending on formulation)  MUCINEX (GUAIFENICEN) - usually longer acting  COUGH DROPS/LOZENGES: Whichever over-the-counter agent you prefer!  Here are some suggestions for ingredients to look for (can take both)... BENZOCAINE - numbing effect, also in CHLORASEPTIC THROAT SPRAY MENTHOL - cooling effect  HONEY: has gone head-to-head in several clinical trials with prescription cough medicines and found to be equally effective! Try 1 Teaspoon Honey + 2 Drops Lemon Juice, as much as you want to use. NONE FOR KIDS UNDER AGE 2  HERBAL TEA: There are certain ingredients which help "coat the throat" to relieve pain  such as ELM BARK, LICORICE ROOT, MARSHMALLOW ROOT  PRESCRIPTION TREATMENT FOR COUGH: Reserved for severe cases. Can include pills, syrups or inhalers.    TREAT ACHES & PAINS, FEVER:  Illness causes aches, pains and fever as your body increases its natural inflammation response to help fight the infection.   Rest, good hydration and nutrition, and taking anti-inflammatory medications will help.   Remember: a true fever is a temperature 100.4 or  higher. If you have a fever that is 105.0 or higher, that is a dangerous level and needs medical attention in the office or in the ER!  Can take both of these together if it's ok with your doctor... IBUPROFEN - 400-800 mg every 6 - 8 hours. Ibuprofen and similar medications can cause problems for people with heart disease or history of stomach ulcers, check with Dr A first if you're concerned. Lower doses are usually safe and effective.  TYLENOL (ACETAMINOPHEN) - 500-1000 mg every 6 hours. It won't cause problems with heart or stomach.     TREAT GASTROINTESTINAL SYMPTOMS:  Hydrate, hydrate, hydrate! Try drinking Gatorade/Powerade, broth/soup. Avoid milk and juice, these can make diarrhea worse. You can drink water, of course, but if you are also having vomiting and loose stool you are losing electrolytes which water alone can't replace.  IMMODIUM (LOPERAMIDE) - as directed on the bottle to help with loose stool PRESCRIPTION ZOFRAN (ONDANSETRON) OR PHENERGAN (PROMETHAZINE) - as directed to help nausea and vomiting. Try taking these before you eat if you are having trouble keeping food down.     REMEMBER - THE MOST IMPORTANT THINGS YOU CAN DO TO AVOID CATCHING OR SPREADING ILLNESS INCLUDE:   COVER YOUR COUGH WITH YOUR ARM, NOT WITH YOUR HANDS!   DISINFECT COMMONLY USED SURFACES (SUCH AS   TELEPHONES & DOORKNOBS) WHEN YOU OR SOMEONE CLOSE TO YOU IS FEELING SICK!   BE SURE VACCINES ARE UP TO DATE - GET A FLU SHOT EVERY YEAR! THE FLU CAN BE DEADLY FOR BABIES, ELDERLY FOLKS, AND PEOPLE WITH WEAK IMMUNE SYSTEMS - YOU SHOULD BE VACCINATED TO HELP PREVENT YOURSELF FROM GETTING SICK, BUT ALSO TO PREVENT SOMEONE ELSE FROM GETTING AN INFECTION WHICH MAY HOSPITALIZE OR KILL THEM.  GOOD NUTRITION AND HEALTHY LIFESTYLE WILL HELP YOUR IMMUNE SYSTEM YEAR-ROUND! THERE IS NO MAGIC SUPPLEMENT!  AND ABOVE ALL - WASH YOUR HANDS OFTEN!   

## 2015-09-04 ENCOUNTER — Ambulatory Visit (INDEPENDENT_AMBULATORY_CARE_PROVIDER_SITE_OTHER): Payer: BC Managed Care – PPO

## 2015-09-04 DIAGNOSIS — Z1231 Encounter for screening mammogram for malignant neoplasm of breast: Secondary | ICD-10-CM | POA: Diagnosis not present

## 2015-09-04 DIAGNOSIS — Z139 Encounter for screening, unspecified: Secondary | ICD-10-CM

## 2015-09-19 ENCOUNTER — Other Ambulatory Visit: Payer: Self-pay | Admitting: Sports Medicine

## 2015-09-19 ENCOUNTER — Ambulatory Visit: Payer: BC Managed Care – PPO

## 2015-10-31 ENCOUNTER — Ambulatory Visit (INDEPENDENT_AMBULATORY_CARE_PROVIDER_SITE_OTHER): Payer: BC Managed Care – PPO | Admitting: Osteopathic Medicine

## 2015-10-31 ENCOUNTER — Encounter: Payer: Self-pay | Admitting: Osteopathic Medicine

## 2015-10-31 VITALS — BP 146/78 | HR 75 | Temp 98.5°F | Ht 63.5 in | Wt 237.0 lb

## 2015-10-31 DIAGNOSIS — H9202 Otalgia, left ear: Secondary | ICD-10-CM

## 2015-10-31 DIAGNOSIS — R110 Nausea: Secondary | ICD-10-CM | POA: Diagnosis not present

## 2015-10-31 DIAGNOSIS — H9209 Otalgia, unspecified ear: Secondary | ICD-10-CM | POA: Insufficient documentation

## 2015-10-31 MED ORDER — ONDANSETRON HCL 8 MG PO TABS: 4.0000 mg | ORAL_TABLET | Freq: Three times a day (TID) | ORAL | Status: DC | PRN

## 2015-10-31 MED ORDER — RANITIDINE HCL 300 MG PO TABS: ORAL_TABLET | ORAL | Status: DC

## 2015-10-31 NOTE — Progress Notes (Signed)
HPI: Vickie Castillo is a 45 y.o. female who presents to [facility] [facility] today for chief complaint of:  Chief Complaint  Patient presents with  . Ear Pain    LEFT  . Nausea    FOR 2 MONTHS    EAR PAIN . Context: recently treated for eustachian tube dysfunction, she is intermittently using nasal spray and antihistamines . Location: left ear . Quality: first itching, then hearing odd noise/ringing, now just feels stopped up.  . Duration: lasted about 4-5 minutes   NAUSEA . Location: abdominal (no headache/dizziness and nausea)  . Quality: feels like a gagging, no vomiting  . Duration: 2 months . Timing: at first once every few days but now daily several times per day, no correlation to eating, no correlation to time of day  . Modifying factors: was on Saxenda which we stopped but nausea persisted. Recently evaluated for vision prescription, no new glasses yet.  . Assoc signs/symptoms: weakness with the nausea. No headaches w/ nausea, no hx headache/migraine. Was told at one point she had an ocular migraine once or twice 7-8 years. Reviewed optometry notes - pseudopapilledema noted.      Past medical, social and family history reviewed: Past Medical History  Diagnosis Date  . Seasonal allergies   . Fibroids   . Obesity   . 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 Hypertensive retinopathy 08/12/2015    Per optometry records, see scanned docs   Past Surgical History  Procedure Laterality Date  . Myomectomy    . Cesarean section    . Knee surgery Left 04/05/2015  . Abdominal hysterectomy      PARTIAL   Social History  Substance Use Topics  . Smoking status: Never Smoker   . Smokeless tobacco: Not on file  . Alcohol Use: No   Family History  Problem Relation Age of Onset  . Hyperlipidemia Mother   . Hypertension Mother   . Cancer Father 35    non hodgkins lymphoma/testicular  . Cancer Paternal  Grandmother     unknown  . Heart disease Brother 83  . Diabetes Brother     Current Outpatient Prescriptions  Medication Sig Dispense Refill  . AMBULATORY NON FORMULARY MEDICATION BD ultra-fine pen needles 31g x63mm Use as directed 100 each 2  . cetirizine (ZYRTEC) 10 MG tablet Take 10 mg by mouth daily.    . cyclobenzaprine (FLEXERIL) 10 MG tablet TAKE 1/2 TABLET BY MOUTH DAILY AT BEDTIME THEN INCREASE GRADUALLY TO ONE TABLET 3 TIMES DAILY 30 tablet 0  . meloxicam (MOBIC) 15 MG tablet TAKE 1 TABLET BY MOUTH EVERY MORNING WITH BREAKFAST FOR 2 WEEKS THEN DAILY AS NEEDED FOR PAIN 30 tablet 0  . phentermine (ADIPEX-P) 37.5 MG tablet TAKE ONE TABLET BY MOUTH EVERY MORNING (Patient not taking: Reported on 10/31/2015) 30 tablet 0   No current facility-administered medications for this visit.   Allergies  Allergen Reactions  . Percocet [Oxycodone-Acetaminophen] Other (See Comments)    "makes me feel high, like I am floating" per pt      Review of Systems: CONSTITUTIONAL:  No  fever, no chills, No recent illness, No unintentional weight changes HEAD/EYES/EARS/NOSE/THROAT: No  headache, no vision change, no hearing change, No sore throat, No  sinus pressure, (+) ear complaints as per HPI CARDIAC: No  chest pain, No  pressure RESPIRATORY: No  cough, No  shortness of breath/wheeze GASTROINTESTINAL: (+) nausea, No  vomiting, No  abdominal pain, No  blood in stool, No  diarrhea, No  constipation MUSCULOSKELETAL: No  myalgia/arthralgia GENITOURINARY: No  incontinence SKIN: No  rash/wounds/concerning lesions NEUROLOGIC: No  weakness, No  dizziness, No  slurred speech   Exam:  BP 146/78 mmHg  Pulse 75  Temp(Src) 98.5 F (36.9 C) (Oral)  Ht 5' 3.5" (1.613 m)  Wt 237 lb (107.502 kg)  BMI 41.32 kg/m2 Constitutional: VS see above. General Appearance: alert, well-developed, well-nourished, NAD Eyes: Normal lids and conjunctive, non-icteric sclera, PERRLA, EOMI, limited funduscopic exam due to  nondilated Ears, Nose, Mouth, Throat: MMM, Normal external inspection ears/nares/mouth/lips/gums, TM normal bilaterally w/ scant clear effusion bilaterally, small hairs in ear canal (?due to scratching hairs at distal ear canal? Pt des not trim hairs, ear canals otherwise normal bilaterally . Pharynx/tonsils no erythema, no exudate. Nasal mucosa normal.  Neck: No masses, trachea midline. No thyroid enlargement. No tenderness/mass appreciated. No lymphadenopathy Respiratory: Normal respiratory effort. no wheeze, no rhonchi, no rales Cardiovascular: S1/S2 normal, no murmur, no rub/gallop auscultated. RRR. No lower extremity edema. Gastrointestinal: Mild TTP epigastric, no guarding/rebound, no masses. No hepatomegaly, no splenomegaly. No hernia appreciated. Bowel sounds normal. Rectal exam deferred.  Musculoskeletal: Gait normal. No clubbing/cyanosis of digits.  Neurological: No cranial nerve deficit on limited exam. Motor and sensation intact and symmetric, normal gait,  Skin: warm, dry, intact. No rash/ulcer.  Psychiatric: Normal judgment/insight. Normal mood and affect.    No results found for this or any previous visit (from the past 72 hour(s)).  No results found.   ASSESSMENT/PLAN: Nausea not associated with abdominal pain, no heartburn, no correlation to eating, hx ocular migraines but she states this feels different. Gastritis probably wouldn't persist this long, benign abdominal exam, neuro exam ok at this time. Optometry notes mention pseudopapilledema but not true papilledema - I'm unable to appreciate anything of significance on limited funduscopic exam here. Recent labs normal. Pt declines CT head at this time - concerned about expense. I advised I certainly understand concern for cost, but we may be missing something serious (pseudotumor cerebri, other brain pathology) and I would insist on imaging if symptoms persist or any changes or worsening of symptoms.  ER/RTC precautionsreviewed  and would certainly consider brain imaging and/or neurology referral if nausea persists - I'm less suspicious of GI pathology at this time. Meantime treat symptomatically - try antihistamines daily for allergies/sinus drainage which may be contributing to eustachian tube dysfunction, no dizziness so low suspicion for inner/middle ear issue but consider ENT referral or, again, imaging/neuro referral as above. Try OTC Ranitidine for acid/gsatritis, would avoid PPI for now in case would want to check for H Pylori though doubt this given no GERD symptoms. Will monitor closely.   Nausea without vomiting - Ddx includes ocular migraine, increased ICP, other neuro or GI cause - pt declined brain imaging at this time 10/31/15  Acute ear pain, left - Likely eustachian tube dysfunction     All questions were answered. Visit summary with medication list and pertinent instructions was printed for patient to review. ER/RTC precautions were reviewed with the patient. Return if symptoms worsen or fail to improve over next 1-2 weeks.

## 2015-10-31 NOTE — Patient Instructions (Signed)
Nausea, Adult °Nausea is the feeling that you have an upset stomach or have to vomit. Nausea by itself is not likely a serious concern, but it may be an early sign of more serious medical problems. As nausea gets worse, it can lead to vomiting. If vomiting develops, there is the risk of dehydration.  °CAUSES  °· Viral infections. °· Food poisoning. °· Medicines. °· Pregnancy. °· Motion sickness. °· Migraine headaches. °· Emotional distress. °· Severe pain from any source. °· Alcohol intoxication. °HOME CARE INSTRUCTIONS °· Get plenty of rest. °· Ask your caregiver about specific rehydration instructions. °· Eat small amounts of food and sip liquids more often. °· Take all medicines as told by your caregiver. °SEEK MEDICAL CARE IF: °· You have not improved after 2 days, or you get worse. °· You have a headache. °SEEK IMMEDIATE MEDICAL CARE IF:  °· You have a fever. °· You faint. °· You keep vomiting or have blood in your vomit. °· You are extremely weak or dehydrated. °· You have dark or bloody stools. °· You have severe chest or abdominal pain. °MAKE SURE YOU: °· Understand these instructions. °· Will watch your condition. °· Will get help right away if you are not doing well or get worse. °  °This information is not intended to replace advice given to you by your health care provider. Make sure you discuss any questions you have with your health care provider. °  °Document Released: 06/18/2004 Document Revised: 06/01/2014 Document Reviewed: 01/21/2011 °Elsevier Interactive Patient Education ©2016 [employer] ° °

## 2015-11-06 ENCOUNTER — Other Ambulatory Visit: Payer: Self-pay | Admitting: Sports Medicine

## 2015-12-17 ENCOUNTER — Other Ambulatory Visit: Payer: Self-pay | Admitting: Osteopathic Medicine

## 2015-12-17 NOTE — Telephone Encounter (Signed)
Patient request refill for Meloxicam. Please advise if approved. Rhonda Cunningham,CMA

## 2016-01-22 ENCOUNTER — Other Ambulatory Visit: Payer: Self-pay | Admitting: Osteopathic Medicine

## 2016-02-18 ENCOUNTER — Ambulatory Visit: Payer: BC Managed Care – PPO | Admitting: Sports Medicine

## 2016-02-20 ENCOUNTER — Encounter: Payer: Self-pay | Admitting: Sports Medicine

## 2016-02-20 ENCOUNTER — Ambulatory Visit (INDEPENDENT_AMBULATORY_CARE_PROVIDER_SITE_OTHER): Payer: BC Managed Care – PPO

## 2016-02-20 ENCOUNTER — Ambulatory Visit (INDEPENDENT_AMBULATORY_CARE_PROVIDER_SITE_OTHER): Payer: BC Managed Care – PPO | Admitting: Sports Medicine

## 2016-02-20 ENCOUNTER — Ambulatory Visit: Payer: BC Managed Care – PPO

## 2016-02-20 VITALS — BP 111/75 | HR 71 | Wt 238.0 lb

## 2016-02-20 DIAGNOSIS — M79671 Pain in right foot: Secondary | ICD-10-CM

## 2016-02-20 DIAGNOSIS — M170 Bilateral primary osteoarthritis of knee: Secondary | ICD-10-CM

## 2016-02-20 DIAGNOSIS — M25471 Effusion, right ankle: Secondary | ICD-10-CM

## 2016-02-20 DIAGNOSIS — M79672 Pain in left foot: Secondary | ICD-10-CM | POA: Diagnosis not present

## 2016-02-20 DIAGNOSIS — M25571 Pain in right ankle and joints of right foot: Secondary | ICD-10-CM

## 2016-02-20 DIAGNOSIS — A084 Viral intestinal infection, unspecified: Secondary | ICD-10-CM | POA: Insufficient documentation

## 2016-02-20 DIAGNOSIS — M722 Plantar fascial fibromatosis: Secondary | ICD-10-CM | POA: Insufficient documentation

## 2016-02-20 MED ORDER — DICLOFENAC SODIUM 75 MG PO TBEC: 75.0000 mg | DELAYED_RELEASE_TABLET | Freq: Two times a day (BID) | ORAL | 3 refills | Status: DC

## 2016-02-20 NOTE — Assessment & Plan Note (Signed)
Status post left knee arthroscopy with meniscal debridement. Injection as above.  Return in one month, we will get another MRI should she continue to have pain.

## 2016-02-20 NOTE — Progress Notes (Signed)
  Subjective:    CC: Follow-up  HPI: Left knee pain: History of osteoarthritis and degenerative meniscal tearing, she is post arthroscopy, she improved slightly but continues to have some pain. Moderate, persistent, localized at the medial joint line without mechanical symptoms or radiation.  Right foot pain: Localized over the dorsum of the midfoot as well as the anterior tibiotalar joint, worse with inversion and plantarflexion, no injuries, no trauma, no swelling. Also has some pain on the plantar arch. Moderate, persistent.  Past medical history:  Negative.  See flowsheet/record as well for more information.  Surgical history: Negative.  See flowsheet/record as well for more information.  Family history: Negative.  See flowsheet/record as well for more information.  Social history: Negative.  See flowsheet/record as well for more information.  Allergies, and medications have been entered into the medical record, reviewed, and no changes needed.   Review of Systems: No fevers, chills, night sweats, weight loss, chest pain, or shortness of breath.   Objective:    General: Well Developed, well nourished, and in no acute distress.  Neuro: Alert and oriented x3, extra-ocular muscles intact, sensation grossly intact.  HEENT: Normocephalic, atraumatic, pupils equal round reactive to light, neck supple, no masses, no lymphadenopathy, thyroid nonpalpable.  Skin: Warm and dry, no rashes. Cardiac: Regular rate and rhythm, no murmurs rubs or gallops, no lower extremity edema.  Respiratory: Clear to auscultation bilaterally. Not using accessory muscles, speaking in full sentences. Left Knee: Normal to inspection with no erythema or effusion or obvious bony abnormalities. Tender to palpation at the medial joint line ROM normal in flexion and extension and lower leg rotation. Ligaments with solid consistent endpoints including ACL, PCL, LCL, MCL. Negative Mcmurray's and provocative meniscal  tests. Non painful patellar compression. Patellar and quadriceps tendons unremarkable. Hamstring and quadriceps strength is normal. Right Ankle: No visible erythema or swelling. Range of motion is full in all directions. Strength is 5/5 in all directions. Stable lateral and medial ligaments; squeeze test and kleiger test unremarkable; Talar dome nontender; No pain at base of 5th MT; No tenderness over cuboid; No tenderness over N spot or navicular prominence No tenderness on posterior aspects of lateral and medial malleolus No sign of peroneal tendon subluxations; Negative tarsal tunnel tinel's Tender to palpation in the arch, also reproduction of pain with inversion and plantar flexion.  Procedure: Real-time Ultrasound Guided Injection of left knee Device: GE Logiq E  Verbal informed consent obtained.  Time-out conducted.  Noted no overlying erythema, induration, or other signs of local infection.  Skin prepped in a sterile fashion.  Local anesthesia: Topical Ethyl chloride.  With sterile technique and under real time ultrasound guidance:  1 mL kenalog 40, 2 mL lidocaine, 2 mL Marcaine injected easily. Completed without difficulty  Pain immediately resolved suggesting accurate placement of the medication.  Advised to call if fevers/chills, erythema, induration, drainage, or persistent bleeding.  Images permanently stored and available for review in the ultrasound unit.  Impression: Technically successful ultrasound guided injection.  Impression and Recommendations:    Primary osteoarthritis of both knees Status post left knee arthroscopy with meniscal debridement. Injection as above.  Return in one month, we will get another MRI should she continue to have pain.  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 X-rays of the ankle and foot. Switching to diclofenac. Return for custom orthotics.

## 2016-02-20 NOTE — Assessment & Plan Note (Signed)
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 X-rays of the ankle and foot. Switching to diclofenac. Return for custom orthotics.

## 2016-02-25 ENCOUNTER — Ambulatory Visit (INDEPENDENT_AMBULATORY_CARE_PROVIDER_SITE_OTHER): Payer: BC Managed Care – PPO | Admitting: Sports Medicine

## 2016-02-25 ENCOUNTER — Encounter: Payer: Self-pay | Admitting: Sports Medicine

## 2016-02-25 DIAGNOSIS — M79671 Pain in right foot: Secondary | ICD-10-CM | POA: Diagnosis not present

## 2016-02-25 DIAGNOSIS — M170 Bilateral primary osteoarthritis of knee: Secondary | ICD-10-CM

## 2016-02-25 NOTE — Progress Notes (Signed)

## 2016-02-25 NOTE — Assessment & Plan Note (Signed)
There is an element of plantar fasciitis, custom orthotics as above. Rehabilitation exercises given. Return in one month.

## 2016-02-25 NOTE — Assessment & Plan Note (Signed)
Left knee is doing well after injection last week.

## 2016-03-24 ENCOUNTER — Ambulatory Visit: Payer: BC Managed Care – PPO | Admitting: Sports Medicine

## 2016-03-30 ENCOUNTER — Encounter: Payer: Self-pay | Admitting: Sports Medicine

## 2016-03-30 ENCOUNTER — Ambulatory Visit (INDEPENDENT_AMBULATORY_CARE_PROVIDER_SITE_OTHER): Payer: BC Managed Care – PPO | Admitting: Sports Medicine

## 2016-03-30 DIAGNOSIS — Z23 Encounter for immunization: Secondary | ICD-10-CM | POA: Diagnosis not present

## 2016-03-30 DIAGNOSIS — M79671 Pain in right foot: Secondary | ICD-10-CM

## 2016-03-30 NOTE — Assessment & Plan Note (Signed)
Plantar fasciitis has resolved with custom orthotics and stretches. Return as needed.

## 2016-03-30 NOTE — Progress Notes (Signed)
  Subjective:    CC: Follow-up  HPI: Plantar fasciitis: Resolved with custom orthotics and rehabilitation exercises. No further complaints.  Past medical history:  Negative.  See flowsheet/record as well for more information.  Surgical history: Negative.  See flowsheet/record as well for more information.  Family history: Negative.  See flowsheet/record as well for more information.  Social history: Negative.  See flowsheet/record as well for more information.  Allergies, and medications have been entered into the medical record, reviewed, and no changes needed.   Review of Systems: No fevers, chills, night sweats, weight loss, chest pain, or shortness of breath.   Objective:    General: Well Developed, well nourished, and in no acute distress.  Neuro: Alert and oriented x3, extra-ocular muscles intact, sensation grossly intact.  HEENT: Normocephalic, atraumatic, pupils equal round reactive to light, neck supple, no masses, no lymphadenopathy, thyroid nonpalpable.  Skin: Warm and dry, no rashes. Cardiac: Regular rate and rhythm, no murmurs rubs or gallops, no lower extremity edema.  Respiratory: Clear to auscultation bilaterally. Not using accessory muscles, speaking in full sentences. Right Foot: No visible erythema or swelling. Range of motion is full in all directions. Strength is 5/5 in all directions. No hallux valgus. No pes cavus or pes planus. No abnormal callus noted. No pain over the navicular prominence, or base of fifth metatarsal. No tenderness to palpation of the calcaneal insertion of plantar fascia. No pain at the Achilles insertion. No pain over the calcaneal bursa. No pain of the retrocalcaneal bursa. No tenderness to palpation over the tarsals, metatarsals, or phalanges. No hallux rigidus or limitus. No tenderness palpation over interphalangeal joints. No pain with compression of the metatarsal heads. Neurovascularly intact distally.  Impression and  Recommendations:    Right foot pain Plantar fasciitis has resolved with custom orthotics and stretches. Return as needed.

## 2016-05-11 ENCOUNTER — Ambulatory Visit (INDEPENDENT_AMBULATORY_CARE_PROVIDER_SITE_OTHER): Payer: BC Managed Care – PPO

## 2016-05-11 ENCOUNTER — Ambulatory Visit (INDEPENDENT_AMBULATORY_CARE_PROVIDER_SITE_OTHER): Payer: BC Managed Care – PPO | Admitting: Family Medicine

## 2016-05-11 ENCOUNTER — Encounter: Payer: Self-pay | Admitting: Family Medicine

## 2016-05-11 VITALS — BP 134/70 | HR 70 | Temp 98.5°F | Wt 239.0 lb

## 2016-05-11 DIAGNOSIS — J209 Acute bronchitis, unspecified: Secondary | ICD-10-CM

## 2016-05-11 MED ORDER — ALBUTEROL SULFATE HFA 108 (90 BASE) MCG/ACT IN AERS: 2.0000 | INHALATION_SPRAY | Freq: Four times a day (QID) | RESPIRATORY_TRACT | 0 refills | Status: DC | PRN

## 2016-05-11 MED ORDER — PREDNISONE 10 MG PO TABS: 30.0000 mg | ORAL_TABLET | Freq: Every day | ORAL | 0 refills | Status: DC

## 2016-05-11 MED ORDER — AZITHROMYCIN 250 MG PO TABS: 250.0000 mg | ORAL_TABLET | Freq: Every day | ORAL | 0 refills | Status: DC

## 2016-05-11 NOTE — Progress Notes (Signed)
Vickie Castillo is a 45 y.o. female who presents to [facility] [place] today for cough congestion and shortness of breath and chest tightness. Symptoms present for a few days and are consistent with prior episodes of bronchitis. She's tried some over-the-counter medicines that helped a bit. She denies any vomiting or diarrhea. She denies severe exertional chest pain or palpitations.   Past Medical History:  Diagnosis Date  . Fibroids   . Hypertensive retinopathy 08/12/2015   Per optometry records, see scanned docs  . Obesity   . 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 Seasonal allergies    Past Surgical History:  Procedure Laterality Date  . ABDOMINAL HYSTERECTOMY     PARTIAL  . CESAREAN SECTION    . KNEE SURGERY Left 04/05/2015  . MYOMECTOMY     Social History  Substance Use Topics  . Smoking status: Never Smoker  . Smokeless tobacco: Not on file  . Alcohol use No   family history includes Cancer in her paternal grandmother; Cancer (age of onset: 34) in her father; Diabetes in her brother; Heart disease (age of onset: 7) in her brother; Hyperlipidemia in her mother; Hypertension in her mother.  ROS as above:  Medications: Current Outpatient Prescriptions  Medication Sig Dispense Refill  . albuterol (PROVENTIL HFA;VENTOLIN HFA) 108 (90 Base) MCG/ACT inhaler Inhale 2 puffs into the lungs every 6 (six) hours as needed for wheezing or shortness of breath. 1 Inhaler 0  . azithromycin (ZITHROMAX) 250 MG tablet Take 1 tablet (250 mg total) by mouth daily. Take first 2 tablets together, then 1 every day until finished. 6 tablet 0  . cetirizine (ZYRTEC) 10 MG tablet Take 10 mg by mouth daily.    . cyclobenzaprine (FLEXERIL) 10 MG tablet TAKE 1/2 TABLET BY MOUTH DAILY AT BEDTIME THEN INCREASE GRADUALLY TO ONE TABLET 3 TIMES DAILY 30 tablet  0  . diclofenac (VOLTAREN) 75 MG EC tablet Take 1 tablet (75 mg total) by mouth 2 (two) times daily. 60 tablet 3  . predniSONE (DELTASONE) 10 MG tablet Take 3 tablets (30 mg total) by mouth daily with breakfast. 15 tablet 0   No current facility-administered medications for this visit.    Allergies  Allergen Reactions  . Percocet [Oxycodone-Acetaminophen] Other (See Comments)    "makes me feel high, like I am floating" per pt    Health Maintenance Health Maintenance  Topic Date Due  . HIV Screening  05/25/2018 (Originally 03/26/1986)  . TETANUS/TDAP  08/21/2025  . INFLUENZA VACCINE  Addressed     Exam:  BP 134/70   Pulse 70   Temp 98.5 F (36.9 C) (Oral)   Wt 239 lb (108.4 kg)   SpO2 100%   BMI 41.67 kg/m  Gen: Well NAD HEENT: EOMI,  MMM Lungs: Normal work of breathing. Coarse breath sounds present bilaterally. Heart: RRR no MRG Abd: NABS, Soft. Nondistended, Nontender Exts: Brisk capillary refill, warm and well perfused.   Patient was given a 2.5/0.5 mg DuoNeb nebulizer treatment, and Felt a little bit better   Twelve-lead EKG: Sinus rhythm at 62 bpm. Normal EKG.    No results found for this or any previous visit (from the past 72 hour(s)). Dg Chest 2 View  Result Date: 05/11/2016 CLINICAL DATA:  Cough.  Acute bronchitis. EXAM: CHEST  2 VIEW COMPARISON:  04/28/2015 FINDINGS: The heart size and mediastinal contours are within normal limits. Both lungs  are clear. The visualized skeletal structures are unremarkable. IMPRESSION: No active cardiopulmonary disease. Electronically Signed   By: Kerby Moors M.D.   On: 05/11/2016 16:57      Assessment and Plan: 45 y.o. female with bronchitis likely viral possibly some component of reactive airway disease based on improvement with DuoNeb nebulizer treatment. Plan to treat with albuterol and prednisone and azithromycin. Return as needed.   Orders Placed This Encounter  Procedures  . DG Chest 2 View    Order Specific  Question:   Reason for exam:    Answer:   Cough, assess intra-thoracic pathology    Order Specific Question:   Is the patient pregnant?    Answer:   No    Order Specific Question:   Preferred imaging location?    Answer:   [facility]    Discussed warning signs or symptoms. Please see discharge instructions. Patient expresses understanding.

## 2016-05-11 NOTE — Patient Instructions (Signed)
Thank you for coming in today. Call or go to the emergency room if you get worse, have trouble breathing, have chest pains, or palpitations.  Use azithromycin as needed if not better.  Call or go to the emergency room if you get worse, have trouble breathing, have chest pains, or palpitations.     Acute Bronchitis, Adult Acute bronchitis is when air tubes (bronchi) in the lungs suddenly get swollen. The condition can make it hard to breathe. It can also cause these symptoms:  A cough.  Coughing up clear, yellow, or green mucus.  Wheezing.  Chest congestion.  Shortness of breath.  A fever.  Body aches.  Chills.  A sore throat. Follow these instructions at home: Medicines  Take over-the-counter and prescription medicines only as told by your doctor.  If you were prescribed an antibiotic medicine, take it as told by your doctor. Do not stop taking the antibiotic even if you start to feel better. General instructions  Rest.  Drink enough fluids to keep your pee (urine) clear or pale yellow.  Avoid smoking and secondhand smoke. If you smoke and you need help quitting, ask your doctor. Quitting will help your lungs heal faster.  Use an inhaler, cool mist vaporizer, or humidifier as told by your doctor.  Keep all follow-up visits as told by your doctor. This is important. How is this prevented? To lower your risk of getting this condition again:  Wash your hands often with soap and water. If you cannot use soap and water, use hand sanitizer.  Avoid contact with people who have cold symptoms.  Try not to touch your hands to your mouth, nose, or eyes.  Make sure to get the flu shot every year. Contact a doctor if:  Your symptoms do not get better in 2 weeks. Get help right away if:  You cough up blood.  You have chest pain.  You have very bad shortness of breath.  You become dehydrated.  You faint (pass out) or keep feeling like you are going to pass  out.  You keep throwing up (vomiting).  You have a very bad headache.  Your fever or chills gets worse. This information is not intended to replace advice given to you by your health care provider. Make sure you discuss any questions you have with your health care provider. Document Released: 10/28/2007 Document Revised: 12/18/2015 Document Reviewed: 10/30/2015 Elsevier Interactive Patient Education  2017 [employer].

## 2016-05-12 NOTE — Addendum Note (Signed)
Addended by: Huel Cote on: 05/12/2016 04:13 PM   Modules accepted: Orders

## 2016-07-20 ENCOUNTER — Ambulatory Visit: Payer: BC Managed Care – PPO | Admitting: Osteopathic Medicine

## 2016-07-21 ENCOUNTER — Encounter: Payer: Self-pay | Admitting: Osteopathic Medicine

## 2016-07-21 ENCOUNTER — Ambulatory Visit (INDEPENDENT_AMBULATORY_CARE_PROVIDER_SITE_OTHER): Payer: BC Managed Care – PPO | Admitting: Osteopathic Medicine

## 2016-07-21 VITALS — BP 138/87 | HR 67 | Ht 63.0 in | Wt 239.0 lb

## 2016-07-21 DIAGNOSIS — J0110 Acute frontal sinusitis, unspecified: Secondary | ICD-10-CM

## 2016-07-21 DIAGNOSIS — M79671 Pain in right foot: Secondary | ICD-10-CM | POA: Diagnosis not present

## 2016-07-21 MED ORDER — AMOXICILLIN-POT CLAVULANATE 875-125 MG PO TABS: 1.0000 | ORAL_TABLET | Freq: Two times a day (BID) | ORAL | 0 refills | Status: DC

## 2016-07-21 MED ORDER — IPRATROPIUM BROMIDE 0.03 % NA SOLN: 2.0000 | Freq: Four times a day (QID) | NASAL | 0 refills | Status: DC | PRN

## 2016-07-21 NOTE — Progress Notes (Signed)
HPI: Vickie Castillo is a 46 y.o. female who presents to [facility] 07/21/16 for chief complaint of:  Chief Complaint  Patient presents with  . Sinus Problem    Acute Illness: . 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 . Location/Quality: Sinus congestion above the eyes is greatest complaint at this point, no shortness of breath or cough . Duration: Maybe 2 weeks or so . Modifying factors: OTC medications have not been particularly helpful  Musculoskeletal: Patient complains of some persistent back pain/sciatica symptoms on the right, some right toe numbness which has been ongoing and over the past few weeks has developed some on and off tingling-type feeling. No weakness or footdrop, no fall. Previously evaluated for this with Dr. Darene Lamer and had some injections. MRI the lumbar spine in September 2016. IMPRESSION: Lumbar spine 1. Focal prominent soft disc extrusion into the right neural foramen and right lateral recess compressing the right L5 nerve. 2. Small broad-based disc protrusion at L4-5 slightly asymmetric to the left without neural impingement. 3. Small broad-based disc protrusion at L3-4 without neural Impingement.   Past medical, social and family history reviewed.   Immune compromising conditions or other risk factors: none  Current medications and allergies reviewed.     Review of Systems:  Constitutional: No  fever/chills  HEENT: Yes  headache, No  sore throat, No  swollen glands  Cardiovascular: No chest pain  Respiratory:No  cough, No  shortness of breath  Gastrointestinal: No  nausea, No  vomiting,  No  diarrhea  Musculoskeletal:   No  myalgia/arthralgia  Skin/Integument:  No  rash   Detailed Exam:  BP 138/87   Pulse 67   Ht 5\' 3"  (1.6 m)   Wt 239 lb (108.4 kg)   BMI 42.34 kg/m   Constitutional:   VSS, see above.   General Appearance: alert, well-developed, well-nourished, NAD  Eyes:    Normal lids and conjunctive, non-icteric sclera  Ears, Nose, Mouth, Throat:   Normal external inspection ears/nares  Normal mouth/lips/gums, MMM  normal TM  posterior pharynx without erythema, without exudate  nasal mucosa normal  Musculoskeletal:  Normal strength to dorsiflexion/plantar flexion bilaterally, normal inversion/a version. Strength 5 out of 5.  Skin:  Normal inspection, no rash or concerning lesions noted on limited exam  Neck:   No masses, trachea midline. normal lymph nodes  Respiratory:   Normal respiratory effort.   No  wheeze/rhonchi/rales  Cardiovascular:   S1/S2 normal, no murmur/rub/gallop auscultated. RRR.   No results found for this or any previous visit (from the past 72 hour(s)). No results found.   ASSESSMENT/PLAN:  Acute frontal sinusitis, recurrence not specified - Plan: amoxicillin-clavulanate (AUGMENTIN) 875-125 MG tablet, ipratropium (ATROVENT) 0.03 % nasal spray  Right foot pain - Previously following the sports medicine, advised if discomfort persists to follow-up with Dr. Darene Lamer, consider nerve impingement syndrome,    Visit summary was printed for the patient with medications and pertinent instructions for patient to review. ER/RTC precautions reviewed. All questions answered. Return if symptoms worsen or fail to improve.

## 2016-07-21 NOTE — Patient Instructions (Signed)
Note: the following list assumes no pregnancy, normal liver & kidney function and no other drug interactions. Dr. Sheppard Coil has highlighted medications which are safe for you to use, but these may not be appropriate for everyone. Always ask a pharmacist or qualified medical provider if there are any questions!    Aches/Pains, Fever Acetaminophen (Tylenol) 500 mg tablets - take max 2 tablets (1000 mg) every 6 hours (4 times per day)  Ibuprofen (Motrin) 200 mg tablets - take max 4 tablets (800 mg) every 6 hours  Sinus Congestion Prescription Atrovent Cromolyn Nasal Spray (NasalCrom) 1 spray each nostril 3-4 times per day, max 6 imes per day Nasal Saline if desired Phenylephrine (Sudafed) 10 mg tablets every 4 hours (or the 12-hour formulation) Diphenhydramine (Benadryl) 25 mg tablets - take max 2 tablets every 4 hours  Cough & Sore Throat Prescription cough pills or syrups Dextromethorphan (Robitussin, others) - cough suppressant Guaifenesin (Robitussin, Mucinex, others) - expectorant (helps cough up mucus) (Dextromethorphan and Guaifenesin also come in a combination tablet) Lozenges w/ Benzocaine + Menthol (Cepacol) Honey - as much as you want! Teas which "coat the throat" - look for ingredients Elm Bark, Licorice Root, Marshmallow Root  Other Zinc Lozenges within 24 hours of symptoms onset - mixed evidence this shortens the duration of the common cold Don't waste your money on Vitamin C or Echinacea

## 2016-08-10 ENCOUNTER — Telehealth: Payer: Self-pay

## 2016-08-10 DIAGNOSIS — Z8669 Personal history of other diseases of the nervous system and sense organs: Secondary | ICD-10-CM

## 2016-08-10 NOTE — Telephone Encounter (Signed)
Patient called stated that she had a sleep study done 5 years ago. She stated that her old CPAP machine broke and because the last sleep study was so long she has to start the process all over so patient is requesting a referral for a sleep study. Please advise. Rhonda Cunningham,CMA

## 2016-08-10 NOTE — Telephone Encounter (Signed)
Referral placed.

## 2016-08-11 NOTE — Telephone Encounter (Signed)
Left message on patient vm advising that referral was placed for a sleep study. Vickie Castillo,CMA

## 2016-08-24 ENCOUNTER — Ambulatory Visit (INDEPENDENT_AMBULATORY_CARE_PROVIDER_SITE_OTHER): Payer: PRIVATE HEALTH INSURANCE | Admitting: Sports Medicine

## 2016-08-24 VITALS — BP 120/76 | HR 89 | Wt 239.7 lb

## 2016-08-24 DIAGNOSIS — M170 Bilateral primary osteoarthritis of knee: Secondary | ICD-10-CM

## 2016-08-24 DIAGNOSIS — M5416 Radiculopathy, lumbar region: Secondary | ICD-10-CM | POA: Diagnosis not present

## 2016-08-24 DIAGNOSIS — M1711 Unilateral primary osteoarthritis, right knee: Secondary | ICD-10-CM | POA: Diagnosis not present

## 2016-08-24 NOTE — Assessment & Plan Note (Signed)
Left knee injection as above.
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
## 2016-08-24 NOTE — Assessment & Plan Note (Signed)
Right L4 distribution numbness to the great toe, not yet ready to consider injection.

## 2016-08-26 ENCOUNTER — Other Ambulatory Visit (HOSPITAL_COMMUNITY): Payer: Self-pay | Admitting: Obstetrics & Gynecology

## 2016-08-26 ENCOUNTER — Telehealth: Payer: Self-pay | Admitting: Osteopathic Medicine

## 2016-08-26 DIAGNOSIS — Z8669 Personal history of other diseases of the nervous system and sense organs: Secondary | ICD-10-CM

## 2016-08-26 DIAGNOSIS — Z1231 Encounter for screening mammogram for malignant neoplasm of breast: Secondary | ICD-10-CM

## 2016-08-26 NOTE — Telephone Encounter (Signed)
Vickie Castillo was one of the sleep studies we sent to [facility] before they closed and they were unable to schedule with her can you please put in an order for the sleep test and I will fax it to [facility] thank you  cindy

## 2016-08-26 NOTE — Telephone Encounter (Signed)
Done! Let me know if I ordered this incorrectly!

## 2016-08-27 NOTE — Telephone Encounter (Signed)
I faxed this order on 4/3 to [facility] I will let you know if anything needs to be changed if they contact me. - CF

## 2016-09-08 ENCOUNTER — Ambulatory Visit: Payer: BC Managed Care – PPO | Admitting: Obstetrics & Gynecology

## 2016-09-08 ENCOUNTER — Ambulatory Visit: Payer: BC Managed Care – PPO

## 2016-09-20 ENCOUNTER — Ambulatory Visit (HOSPITAL_BASED_OUTPATIENT_CLINIC_OR_DEPARTMENT_OTHER): Payer: BC Managed Care – PPO | Attending: Osteopathic Medicine | Admitting: Internal Medicine

## 2016-09-20 DIAGNOSIS — R0902 Hypoxemia: Secondary | ICD-10-CM | POA: Diagnosis not present

## 2016-09-20 DIAGNOSIS — G4733 Obstructive sleep apnea (adult) (pediatric): Secondary | ICD-10-CM | POA: Diagnosis present

## 2016-09-20 DIAGNOSIS — I493 Ventricular premature depolarization: Secondary | ICD-10-CM | POA: Diagnosis not present

## 2016-09-20 DIAGNOSIS — G4736 Sleep related hypoventilation in conditions classified elsewhere: Secondary | ICD-10-CM | POA: Insufficient documentation

## 2016-09-20 DIAGNOSIS — Z8669 Personal history of other diseases of the nervous system and sense organs: Secondary | ICD-10-CM

## 2016-09-22 DIAGNOSIS — R7303 Prediabetes: Secondary | ICD-10-CM | POA: Insufficient documentation

## 2016-09-26 DIAGNOSIS — Z8669 Personal history of other diseases of the nervous system and sense organs: Secondary | ICD-10-CM

## 2016-09-26 NOTE — Procedures (Signed)
   Patient Name: Vickie Castillo, Vickie Castillo Date: [id number][id number] Gender: Female D.O.B: [birth year] Age (years): 45 Referring Provider: Emeterio Reeve Height (inches): 63 Interpreting Physician: Baird Lyons MD, ABSM Weight (lbs): 240 RPSGT: Earney Hamburg BMI: 43 MRN: [id number] Neck Size: 16.25 CLINICAL INFORMATION Sleep Study Type: NPSG  Indication for sleep study: Non-refreshing Sleep, Snoring  Epworth Sleepiness Score: 4  SLEEP STUDY TECHNIQUE As per the AASM Manual for the Scoring of Sleep and Associated Events v2.3 (April 2016) with a hypopnea requiring 4% desaturations.  The channels recorded and monitored were frontal, central and occipital EEG, electrooculogram (EOG), submentalis EMG (chin), nasal and oral airflow, thoracic and abdominal wall motion, anterior tibialis EMG, snore microphone, electrocardiogram, and pulse oximetry.  MEDICATIONS Medications self-administered by patient taken the night of the study : none reported  SLEEP ARCHITECTURE The study was initiated at 9:47:16 PM and ended at 4:32:06 AM.  Sleep onset time was 48.3 minutes and the sleep efficiency was 82.9%. The total sleep time was 335.6 minutes.  Stage REM latency was 92.0 minutes.  The patient spent 1.19% of the night in stage N1 sleep, 81.23% in stage N2 sleep, 0.00% in stage N3 and 17.58% in REM.  Alpha intrusion was absent.  Supine sleep was 75.51%.  RESPIRATORY PARAMETERS The overall apnea/hypopnea index (AHI) was 8.8 per hour. There were 31 total apneas, including 29 obstructive, 0 central and 2 mixed apneas. There were 18 hypopneas and 0 RERAs.  The AHI during Stage REM sleep was 45.8 per hour.  AHI while supine was 10.2 per hour.  The mean oxygen saturation was 94.28%. The minimum SpO2 during sleep was 67.00%.  Moderate snoring was noted during this study.  CARDIAC DATA The 2 lead EKG demonstrated sinus rhythm. The mean heart rate was 71.29 beats per minute. Other EKG findings  include: PVCs.  LEG MOVEMENT DATA The total PLMS were 0 with a resulting PLMS index of 0.00. Associated arousal with leg movement index was 0.0 .  IMPRESSIONS - Mild obstructive sleep apnea occurred during this study (AHI = 8.8/h). - Apneas mainly in REM- REM AHI 45.8/ hr. - No significant central sleep apnea occurred during this study (CAI = 0.0/h). - Severe oxygen desaturation was noted during this study (Min O2 = 67.00%, Mean 94.3%). - The patient snored with Moderate snoring volume. - EKG findings include PVCs. - Clinically significant periodic limb movements did not occur during sleep. No significant associated arousals.  DIAGNOSIS - Obstructive Sleep Apnea (327.23 [G47.33 ICD-10]) - Nocturnal Hypoxemia (327.26 [G47.36 ICD-10])  RECOMMENDATIONS - Consider CPAP, fiatted oral appliance, or surgical evaluation, while emphasizing weight loss if appropriate. - Positional therapy avoiding supine position during sleep. - Avoid alcohol, sedatives and other CNS depressants that may worsen sleep apnea and disrupt normal sleep architecture. - Sleep hygiene should be reviewed to assess factors that may improve sleep quality. - Weight management and regular exercise should be initiated or continued if appropriate.  [Electronically signed] 09/26/2016 11:24 AM  Baird Lyons MD, ABSM Diplomate, American Board of Sleep Medicine   NPI: [id number]  [place], American Board of Sleep Medicine  ELECTRONICALLY SIGNED ON:  09/26/2016, 11:19 AM [place] PH: [phone]   FX: [phone] [place]

## 2016-09-30 ENCOUNTER — Other Ambulatory Visit: Payer: Self-pay | Admitting: Osteopathic Medicine

## 2016-09-30 MED ORDER — AMBULATORY NON FORMULARY MEDICATION: 1 refills | Status: AC

## 2016-10-07 ENCOUNTER — Ambulatory Visit (INDEPENDENT_AMBULATORY_CARE_PROVIDER_SITE_OTHER): Payer: BC Managed Care – PPO

## 2016-10-07 DIAGNOSIS — Z1231 Encounter for screening mammogram for malignant neoplasm of breast: Secondary | ICD-10-CM | POA: Diagnosis not present

## 2016-11-02 ENCOUNTER — Telehealth: Payer: Self-pay

## 2016-11-02 NOTE — Telephone Encounter (Signed)
Pt left VM stating she is starting to have significant knee pain after her surgery. Would like to know what her next step should be (injection, x-rays, MRI, etc.) Please advise.

## 2016-11-02 NOTE — Telephone Encounter (Signed)
Is she talking about her son?  If its her own knee she needs to discuss this with the doctor that operated.

## 2016-11-03 NOTE — Telephone Encounter (Signed)
Left VM with information.  

## 2016-11-11 ENCOUNTER — Encounter: Payer: Self-pay | Admitting: Obstetrics & Gynecology

## 2016-11-11 ENCOUNTER — Ambulatory Visit (INDEPENDENT_AMBULATORY_CARE_PROVIDER_SITE_OTHER): Payer: BC Managed Care – PPO | Admitting: Obstetrics & Gynecology

## 2016-11-11 VITALS — BP 119/78 | HR 76 | Resp 16 | Ht 63.0 in | Wt 243.0 lb

## 2016-11-11 DIAGNOSIS — Z01419 Encounter for gynecological examination (general) (routine) without abnormal findings: Secondary | ICD-10-CM
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
## 2016-11-12 DIAGNOSIS — Z90710 Acquired absence of both cervix and uterus: Secondary | ICD-10-CM | POA: Insufficient documentation

## 2016-12-07 ENCOUNTER — Other Ambulatory Visit: Payer: Self-pay | Admitting: Sports Medicine

## 2016-12-07 DIAGNOSIS — M79672 Pain in left foot: Secondary | ICD-10-CM

## 2017-01-04 ENCOUNTER — Other Ambulatory Visit: Payer: Self-pay | Admitting: Sports Medicine

## 2017-01-04 DIAGNOSIS — M79672 Pain in left foot: Secondary | ICD-10-CM

## 2017-02-06 IMAGING — CR DG LUMBAR SPINE COMPLETE 4+V
5 series · 5 of 5 positions shown · non-contrast
Comparison: None.

CLINICAL DATA: Right-sided low back pain radiating into the right
leg with numbness in weakness

EXAM:
LUMBAR SPINE - COMPLETE 4+ VIEW

[l-spine ap]
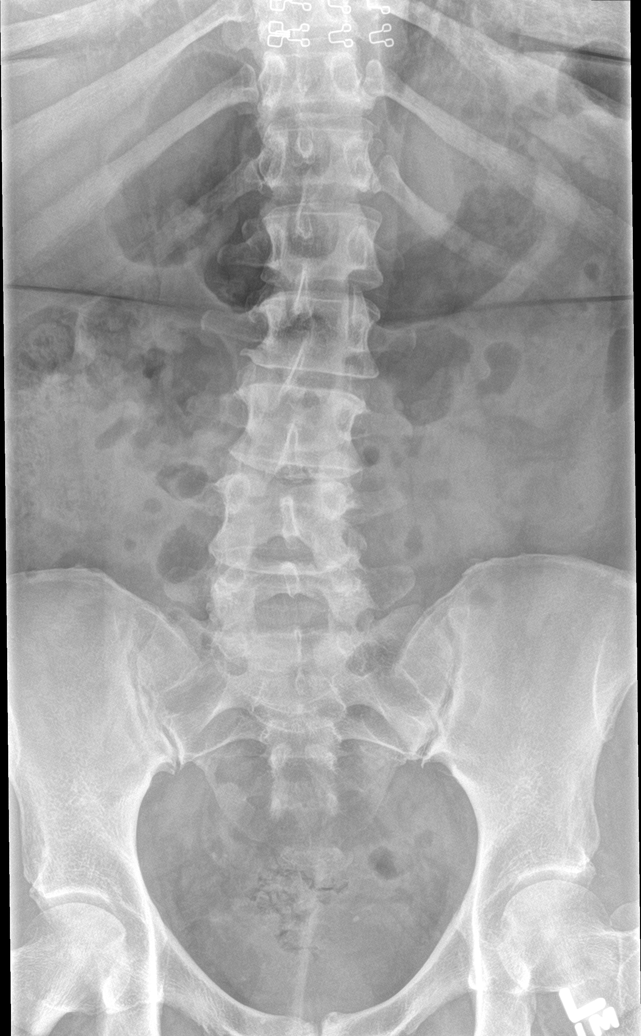

[l-spine obl (1 of 2)]
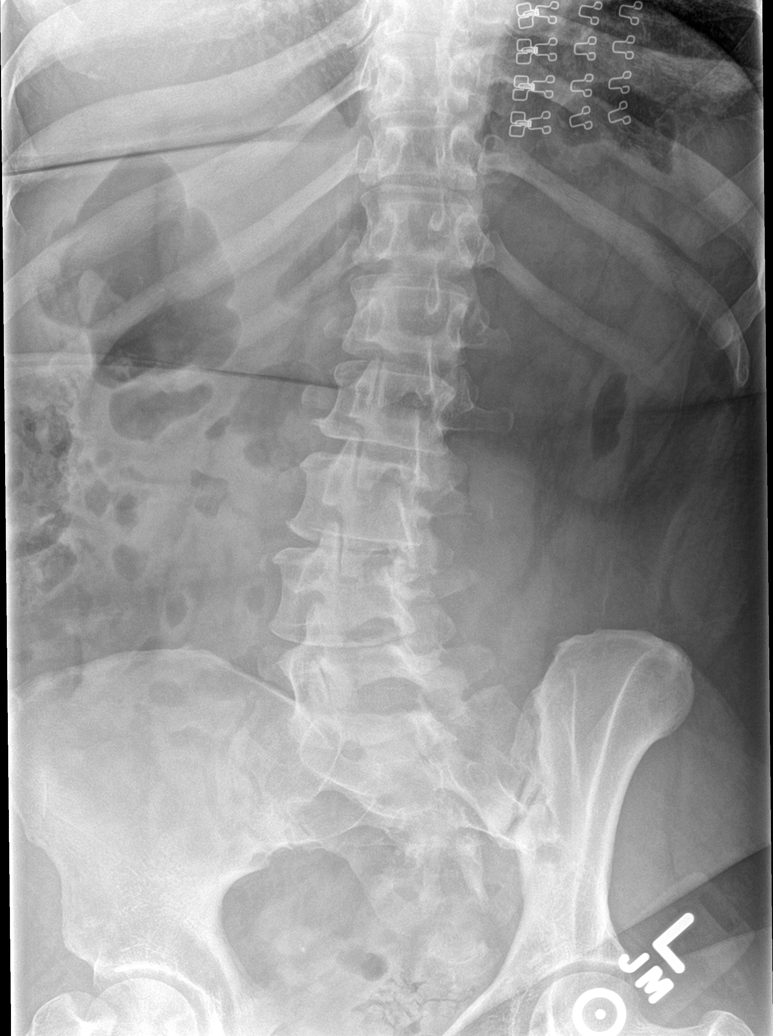

[l-spine obl (2 of 2)]
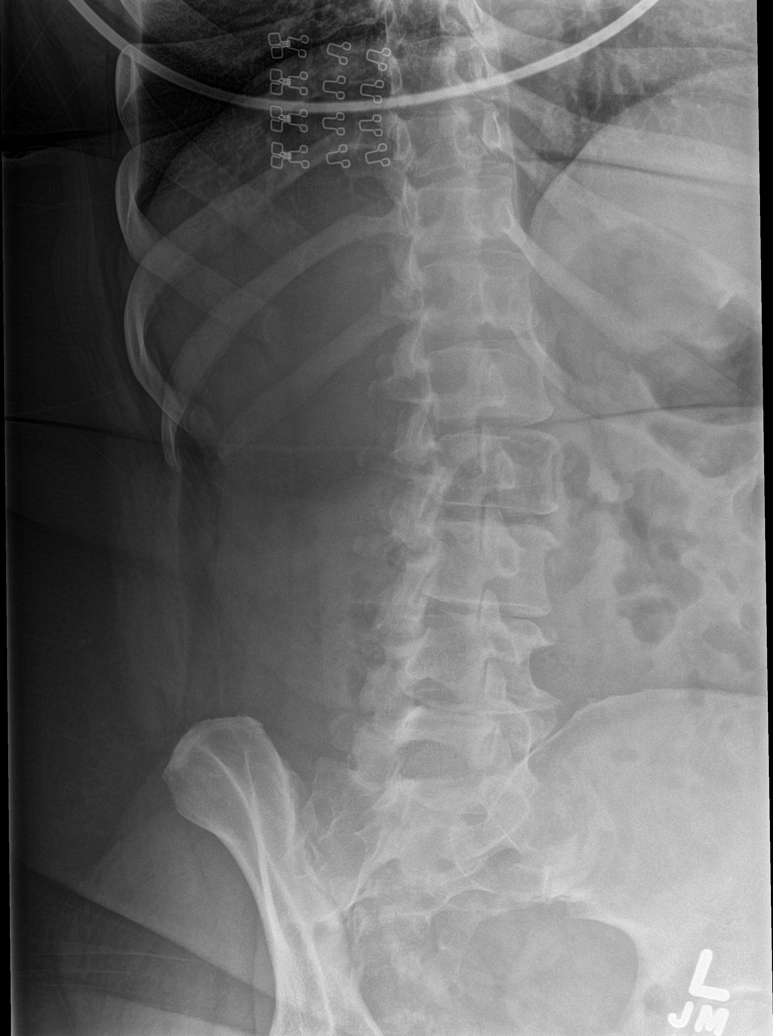

[l-spine lat]
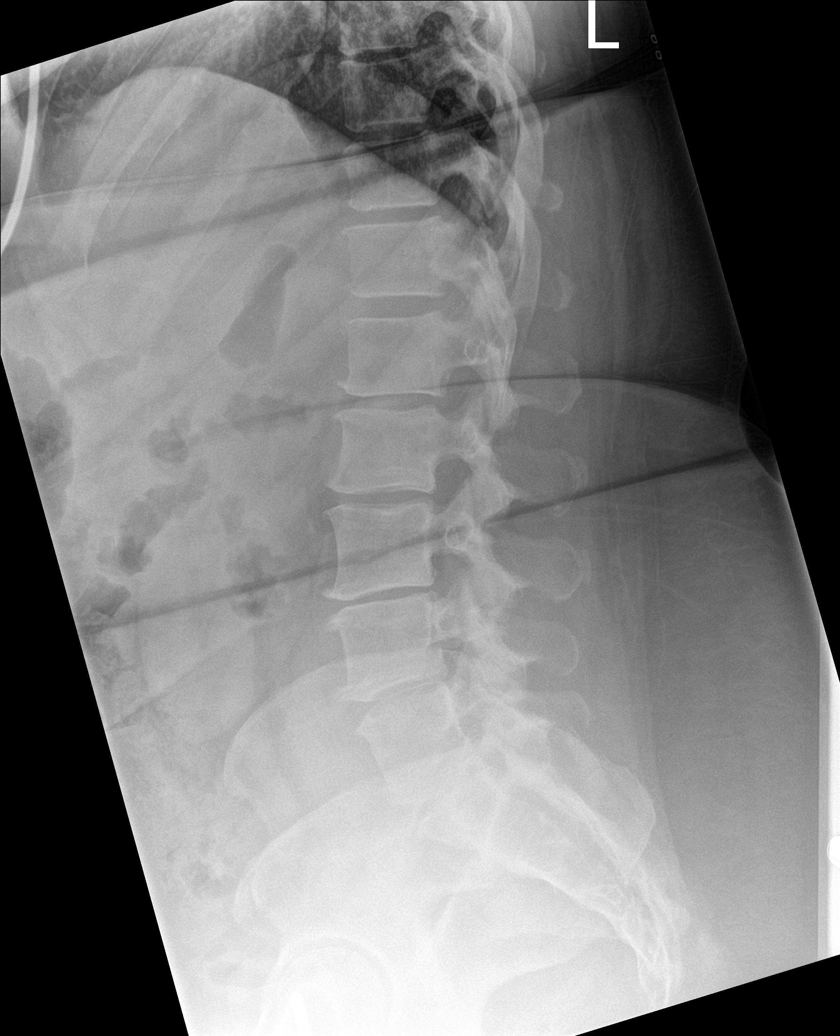

[l-spine spot]
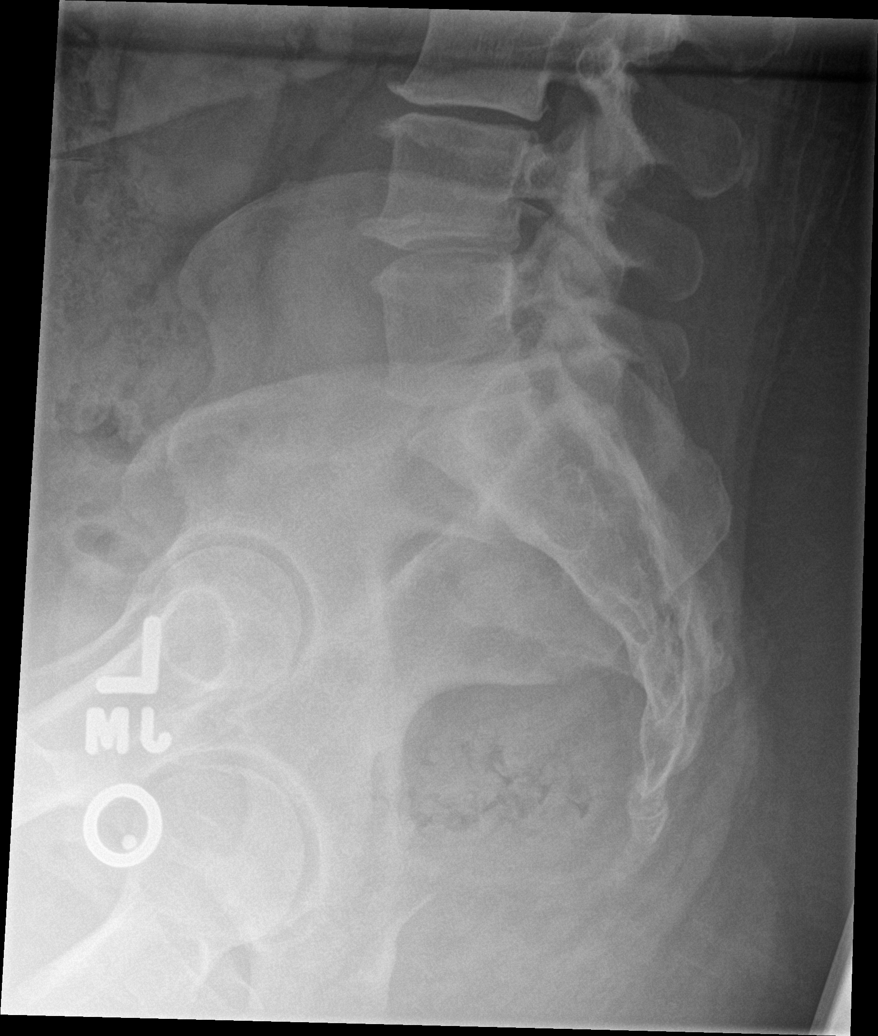

[5 of 5 positions shown; findings below may reference images not displayed]

FINDINGS: The lumbar vertebral bodies are preserved in height. There is gentle
curvature centered in the lower lumbar spine with the convexity
toward the right. The pedicles and transverse processes are intact.
There is facet joint hypertrophy at L4-5 and at L5-S1. There is
moderate multilevel degenerative disc space narrowing. There is no
spondylolisthesis.
IMPRESSION: There is moderate multilevel degenerative disc disease with small
anterior endplate osteophytes there is facet joint hypertrophy at
L4-5 and at L5-S1. There is no compression fracture nor other acute
bony abnormality.

## 2017-02-13 ENCOUNTER — Other Ambulatory Visit: Payer: Self-pay | Admitting: Sports Medicine

## 2017-02-13 DIAGNOSIS — M79672 Pain in left foot: Secondary | ICD-10-CM

## 2017-04-30 ENCOUNTER — Encounter: Payer: Self-pay | Admitting: Sports Medicine

## 2017-04-30 ENCOUNTER — Ambulatory Visit: Payer: BC Managed Care – PPO | Admitting: Sports Medicine

## 2017-04-30 DIAGNOSIS — M5416 Radiculopathy, lumbar region: Secondary | ICD-10-CM

## 2017-04-30 DIAGNOSIS — M170 Bilateral primary osteoarthritis of knee: Secondary | ICD-10-CM

## 2017-04-30 NOTE — Assessment & Plan Note (Addendum)
Did extremely well with previous right L4 and L5 selective nerve root blocks almost 2 years ago, placing an additional order for right L5-S1 and L4-L5 transforaminal epidural injections. She is she is having some great toe numbness consistent with L4 radiculitis but no progressive weakness to suggest radiculopathy.

## 2017-04-30 NOTE — Progress Notes (Signed)
Subjective:    CC: Left knee pain  HPI: This is a pleasant 46 year old female with known knee osteoarthritis, she is now having recurrence of pain, moderate, persistent, localized to the medial joint line with gelling, no mechanical symptoms, no trauma.  This has been going on for the past couple of weeks.  Previous injection was about 7 months ago.  She also has lumbar radiculitis, right-sided, previous epidurals consisted of a right L4-L5 and L5-S1 transforaminal epidurals, this was done almost 2 years ago back in January 2017, now having recurrence of paresthesias into the right great toe.  Past medical history:  Negative.  See flowsheet/record as well for more information.  Surgical history: Negative.  See flowsheet/record as well for more information.  Family history: Negative.  See flowsheet/record as well for more information.  Social history: Negative.  See flowsheet/record as well for more information.  Allergies, and medications have been entered into the medical record, reviewed, and no changes needed.   (To billers/coders, pertinent past medical, social, surgical, family history can be found in problem list, if problem list is marked as reviewed then this indicates that past medical, social, surgical, family history was also reviewed)  Review of Systems: No fevers, chills, night sweats, weight loss, chest pain, or shortness of breath.   Objective:    General: Well Developed, well nourished, and in no acute distress.  Neuro: Alert and oriented x3, extra-ocular muscles intact, sensation grossly intact.  HEENT: Normocephalic, atraumatic, pupils equal round reactive to light, neck supple, no masses, no lymphadenopathy, thyroid nonpalpable.  Skin: Warm and dry, no rashes. Cardiac: Regular rate and rhythm, no murmurs rubs or gallops, no lower extremity edema.  Respiratory: Clear to auscultation bilaterally. Not using accessory muscles, speaking in full sentences. Left knee: Normal  to inspection with no erythema or effusion or obvious bony abnormalities. Minimal swelling, mild tenderness at the medial joint line ROM normal in flexion and extension and lower leg rotation. Ligaments with solid consistent endpoints including ACL, PCL, LCL, MCL. Negative Mcmurray's and provocative meniscal tests. Non painful patellar compression. Patellar and quadriceps tendons unremarkable. Hamstring and quadriceps strength is normal.  Procedure: Real-time Ultrasound Guided Injection of left knee Device: GE Logiq E  Verbal informed consent obtained.  Time-out conducted.  Noted no overlying erythema, induration, or other signs of local infection.  Skin prepped in a sterile fashion.  Local anesthesia: Topical Ethyl chloride.  With sterile technique and under real time ultrasound guidance: 1 cc kenalog 40, 2 cc lidocaine, 2 cc bupivacaine injected easily Completed without difficulty  Pain immediately resolved suggesting accurate placement of the medication.  Advised to call if fevers/chills, erythema, induration, drainage, or persistent bleeding.  Images permanently stored and available for review in the ultrasound unit.  Impression: Technically successful ultrasound guided injection.  Impression and Recommendations:    Primary osteoarthritis of both knees Left knee injection as above, previous injection was 7 months ago. She is getting set up for sleeve gastrectomy and will probably not need any more injections after that.  Right lumbar radiculopathy Did extremely well with previous right L4 and L5 selective nerve root blocks almost 2 years ago, placing an additional order for right L5-S1 and L4-L5 transforaminal epidural injections. She is she is having some great toe numbness consistent with L4 radiculitis but no progressive weakness to suggest radiculopathy. ___________________________________________ Gwen Her. Dianah Field, M.D., ABFM., CAQSM. Primary Care and [place] Instructor of [place]  [employer]

## 2017-04-30 NOTE — Assessment & Plan Note (Signed)
Left knee injection as above, previous injection was 7 months ago. She is getting set up for sleeve gastrectomy and will probably not need any more injections after that.

## 2017-05-13 ENCOUNTER — Telehealth: Payer: Self-pay

## 2017-05-13 NOTE — Telephone Encounter (Signed)
Pt would like to know if she can change her epidural to another facility? She states she can't get it done until January with [facility]. Please advise.

## 2017-05-14 NOTE — Telephone Encounter (Signed)
Yes that is fine, have her pick a facility and I am happy to place the order.

## 2017-05-14 NOTE — Telephone Encounter (Signed)
Left VM with information.  

## 2017-05-28 ENCOUNTER — Other Ambulatory Visit: Payer: BC Managed Care – PPO

## 2017-06-03 ENCOUNTER — Other Ambulatory Visit: Payer: Self-pay | Admitting: Sports Medicine

## 2017-06-03 ENCOUNTER — Ambulatory Visit
Admission: RE | Admit: 2017-06-03 | Discharge: 2017-06-03 | Disposition: A | Discharge status: Home / Self Care | Payer: BC Managed Care – PPO | Source: Ambulatory Visit | Attending: Sports Medicine | Admitting: Sports Medicine

## 2017-06-03 DIAGNOSIS — E559 Vitamin D deficiency, unspecified: Secondary | ICD-10-CM | POA: Insufficient documentation

## 2017-06-03 DIAGNOSIS — M5430 Sciatica, unspecified side: Secondary | ICD-10-CM | POA: Insufficient documentation

## 2017-06-03 DIAGNOSIS — J069 Acute upper respiratory infection, unspecified: Secondary | ICD-10-CM | POA: Insufficient documentation

## 2017-06-03 DIAGNOSIS — J309 Allergic rhinitis, unspecified: Secondary | ICD-10-CM | POA: Insufficient documentation

## 2017-06-03 DIAGNOSIS — M5416 Radiculopathy, lumbar region: Secondary | ICD-10-CM

## 2017-06-03 DIAGNOSIS — M533 Sacrococcygeal disorders, not elsewhere classified: Secondary | ICD-10-CM | POA: Insufficient documentation

## 2017-06-03 MED ORDER — IOPAMIDOL (ISOVUE-M 200) INJECTION 41%
1.0000 mL | Freq: Once | INTRAMUSCULAR | Status: AC
Start: 2017-06-03 — End: 2017-06-03
  Administered 2017-06-03: 1 mL via EPIDURAL

## 2017-06-03 MED ORDER — METHYLPREDNISOLONE ACETATE 40 MG/ML INJ SUSP (RADIOLOG
120.0000 mg | Freq: Once | INTRAMUSCULAR | Status: AC
  Administered 2017-06-03: 120 mg via EPIDURAL

## 2017-06-03 NOTE — Discharge Instructions (Signed)

## 2017-08-10 ENCOUNTER — Ambulatory Visit (INDEPENDENT_AMBULATORY_CARE_PROVIDER_SITE_OTHER): Payer: BC Managed Care – PPO

## 2017-08-10 ENCOUNTER — Encounter: Payer: Self-pay | Admitting: Sports Medicine

## 2017-08-10 ENCOUNTER — Ambulatory Visit (INDEPENDENT_AMBULATORY_CARE_PROVIDER_SITE_OTHER): Payer: BC Managed Care – PPO | Admitting: Sports Medicine

## 2017-08-10 DIAGNOSIS — G8929 Other chronic pain: Secondary | ICD-10-CM

## 2017-08-10 DIAGNOSIS — M170 Bilateral primary osteoarthritis of knee: Secondary | ICD-10-CM

## 2017-08-10 DIAGNOSIS — M25562 Pain in left knee: Secondary | ICD-10-CM

## 2017-08-10 NOTE — Assessment & Plan Note (Addendum)
Left knee injection today, previous injection was 3 months ago. She is getting set up for sleeve gastrectomy. If pain recurs within 3 months we will proceed with Visco supplementation. In the meantime we are going to proceed with x-rays and an MRI, she is getting some mechanical symptoms so we are doing arthroscopy planning.

## 2017-08-10 NOTE — Progress Notes (Signed)
Subjective:    CC: Left knee pain  HPI: This is a very pleasant 47 year old female, for the past several weeks she has had worsening knee pain, we did an injection about 3 months ago.  Pain is the medial joint line, moderate, persistent, no radiation past the mid shin.  Occasional clicks and catches.  Desires interventional treatment today, she is getting set up for sleeve gastrectomy.  I reviewed the past medical history, family history, social history, surgical history, and allergies today and no changes were needed.  Please see the problem list section below in epic for further details.  Past Medical History: Past Medical History:  Diagnosis Date  . Fibroids   . Hypertensive retinopathy 08/12/2015   Per optometry records, see scanned docs  . Obesity   . 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 Seasonal allergies    Past Surgical History: Past Surgical History:  Procedure Laterality Date  . ABDOMINAL HYSTERECTOMY     PARTIAL  . CESAREAN SECTION    . KNEE SURGERY Left 04/05/2015  . MYOMECTOMY     Social History: Social History   Socioeconomic History  . Marital status: Married    Spouse name: None  . Number of children: None  . Years of education: None  . Highest education level: None  Social Needs  . Financial resource strain: None  . Food insecurity - worry: None  . Food insecurity - inability: None  . Transportation needs - medical: None  . Transportation needs - non-medical: None  Occupational History  . None  Tobacco Use  . Smoking status: Never Smoker  . Smokeless tobacco: Never Used  Substance and Sexual Activity  . Alcohol use: No    Alcohol/week: 0.0 oz  . Drug use: No  . Sexual activity: Yes    Partners: Male    Birth control/protection: Surgical    Comment: Hysterectomy  Other Topics Concern  . None  Social History Narrative  . None   Family History: Family History  Problem Relation Age of Onset  .  Hyperlipidemia Mother   . Hypertension Mother   . Cancer Father 55       non hodgkins lymphoma/testicular  . Cancer Paternal Grandmother        unknown  . Heart disease Brother 39  . Diabetes Brother    Allergies: Allergies  Allergen Reactions  . Percocet [Oxycodone-Acetaminophen] Other (See Comments)    "makes me feel high, like I am floating" per pt   Medications: See med rec.  Review of Systems: No fevers, chills, night sweats, weight loss, chest pain, or shortness of breath.   Objective:    General: Well Developed, well nourished, and in no acute distress.  Neuro: Alert and oriented x3, extra-ocular muscles intact, sensation grossly intact.  HEENT: Normocephalic, atraumatic, pupils equal round reactive to light, neck supple, no masses, no lymphadenopathy, thyroid nonpalpable.  Skin: Warm and dry, no rashes. Cardiac: Regular rate and rhythm, no murmurs rubs or gallops, no lower extremity edema.  Respiratory: Clear to auscultation bilaterally. Not using accessory muscles, speaking in full sentences. Left knee: Normal to inspection with no erythema or effusion or obvious bony abnormalities. Palpation normal with no warmth or joint line tenderness or patellar tenderness or condyle tenderness. ROM normal in flexion and extension and lower leg rotation. Ligaments with solid consistent endpoints including ACL, PCL, LCL, MCL. Negative Mcmurray's and provocative meniscal tests. Non painful patellar compression. Patellar and quadriceps tendons unremarkable. Hamstring  and quadriceps strength is normal.  Procedure: Real-time Ultrasound Guided Injection of left knee Device: GE Logiq E  Verbal informed consent obtained.  Time-out conducted.  Noted no overlying erythema, induration, or other signs of local infection.  Skin prepped in a sterile fashion.  Local anesthesia: Topical Ethyl chloride.  With sterile technique and under real time ultrasound guidance: 1 cc kenalog 40, 2 cc  lidocaine, 2 cc bupivacaine injected easily Completed without difficulty  Pain immediately resolved suggesting accurate placement of the medication.  Advised to call if fevers/chills, erythema, induration, drainage, or persistent bleeding.  Images permanently stored and available for review in the ultrasound unit.  Impression: Technically successful ultrasound guided injection.  Impression and Recommendations:    Primary osteoarthritis of both knees Left knee injection today, previous injection was 3 months ago. She is getting set up for sleeve gastrectomy. If pain recurs within 3 months we will proceed with Visco supplementation. In the meantime we are going to proceed with x-rays and an MRI, she is getting some mechanical symptoms so we are doing arthroscopy planning. ___________________________________________ Gwen Her. Dianah Field, M.D., ABFM., CAQSM. Primary Care and [place] Instructor of [place] of [facility] of Medicine

## 2017-08-12 ENCOUNTER — Ambulatory Visit: Payer: BC Managed Care – PPO | Admitting: Sports Medicine

## 2017-08-16 ENCOUNTER — Ambulatory Visit (INDEPENDENT_AMBULATORY_CARE_PROVIDER_SITE_OTHER): Payer: BC Managed Care – PPO

## 2017-08-16 DIAGNOSIS — M1712 Unilateral primary osteoarthritis, left knee: Secondary | ICD-10-CM | POA: Diagnosis not present

## 2017-08-16 DIAGNOSIS — M170 Bilateral primary osteoarthritis of knee: Secondary | ICD-10-CM

## 2017-08-16 DIAGNOSIS — M25562 Pain in left knee: Secondary | ICD-10-CM

## 2017-08-16 DIAGNOSIS — G8929 Other chronic pain: Secondary | ICD-10-CM

## 2017-11-15 ENCOUNTER — Other Ambulatory Visit: Payer: Self-pay | Admitting: Family Medicine

## 2017-11-15 ENCOUNTER — Encounter: Payer: Self-pay | Admitting: Sports Medicine

## 2017-11-15 ENCOUNTER — Ambulatory Visit (INDEPENDENT_AMBULATORY_CARE_PROVIDER_SITE_OTHER): Payer: BC Managed Care – PPO | Admitting: Sports Medicine

## 2017-11-15 DIAGNOSIS — M722 Plantar fascial fibromatosis: Secondary | ICD-10-CM | POA: Diagnosis not present

## 2017-11-15 DIAGNOSIS — Z1231 Encounter for screening mammogram for malignant neoplasm of breast: Secondary | ICD-10-CM

## 2017-11-15 NOTE — Assessment & Plan Note (Signed)
Did well 2 years ago with conservative measures for right plantar fasciitis, now having it on the left, needs to avoid barefoot walking, nighttime splint given. Rehab exercises, meloxicam at bedtime. Return to see me in 1 month, injection if no better.

## 2017-11-15 NOTE — Progress Notes (Signed)
Subjective:    CC: Left foot pain  HPI: This is a very pleasant 47 year old female, I treated her 2 years ago for a right-sided plantar fasciitis which responded to conservative measures including custom orthotics.  Unfortunately she starting to have a recurrence of pain, this time on the left, worsening, localized at the calcaneal origin of the plantar fascia and worse with the first few steps in the morning.  She has not been that compliant with wearing her custom orthotics.  I reviewed the past medical history, family history, social history, surgical history, and allergies today and no changes were needed.  Please see the problem list section below in epic for further details.  Past Medical History: Past Medical History:  Diagnosis Date  . Fibroids   . Hypertensive retinopathy 08/12/2015   Per optometry records, see scanned docs  . Obesity   . 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 Seasonal allergies    Past Surgical History: Past Surgical History:  Procedure Laterality Date  . ABDOMINAL HYSTERECTOMY     PARTIAL  . CESAREAN SECTION    . KNEE SURGERY Left 04/05/2015  . MYOMECTOMY     Social History: Social History   Socioeconomic History  . Marital status: Married    Spouse name: Not on file  . Number of children: Not on file  . Years of education: Not on file  . Highest education level: Not on file  Occupational History  . Not on file  Social Needs  . Financial resource strain: Not on file  . Food insecurity:    Worry: Not on file    Inability: Not on file  . Transportation needs:    Medical: Not on file    Non-medical: Not on file  Tobacco Use  . Smoking status: Never Smoker  . Smokeless tobacco: Never Used  Substance and Sexual Activity  . Alcohol use: No    Alcohol/week: 0.0 oz  . Drug use: No  . Sexual activity: Yes    Partners: Male    Birth control/protection: Surgical    Comment: Hysterectomy  Lifestyle  .  Physical activity:    Days per week: Not on file    Minutes per session: Not on file  . Stress: Not on file  Relationships  . Social connections:    Talks on phone: Not on file    Gets together: Not on file    Attends religious service: Not on file    Active member of club or organization: Not on file    Attends meetings of clubs or organizations: Not on file    Relationship status: Not on file  Other Topics Concern  . Not on file  Social History Narrative  . Not on file   Family History: Family History  Problem Relation Age of Onset  . Hyperlipidemia Mother   . Hypertension Mother   . Cancer Father 46       non hodgkins lymphoma/testicular  . Cancer Paternal Grandmother        unknown  . Heart disease Brother 80  . Diabetes Brother    Allergies: Allergies  Allergen Reactions  . Percocet [Oxycodone-Acetaminophen] Other (See Comments)    "makes me feel high, like I am floating" per pt   Medications: See med rec.  Review of Systems: No fevers, chills, night sweats, weight loss, chest pain, or shortness of breath.   Objective:    General: Well Developed, well nourished, and in no  acute distress.  Neuro: Alert and oriented x3, extra-ocular muscles intact, sensation grossly intact.  HEENT: Normocephalic, atraumatic, pupils equal round reactive to light, neck supple, no masses, no lymphadenopathy, thyroid nonpalpable.  Skin: Warm and dry, no rashes. Cardiac: Regular rate and rhythm, no murmurs rubs or gallops, no lower extremity edema.  Respiratory: Clear to auscultation bilaterally. Not using accessory muscles, speaking in full sentences. Left foot: No visible erythema or swelling. Range of motion is full in all directions. Strength is 5/5 in all directions. No hallux valgus. No pes cavus or pes planus. No abnormal callus noted. No pain over the navicular prominence, or base of fifth metatarsal. Mild tenderness to palpation of the calcaneal insertion of plantar  fascia. No pain at the Achilles insertion. No pain over the calcaneal bursa. No pain of the retrocalcaneal bursa. No tenderness to palpation over the tarsals, metatarsals, or phalanges. No hallux rigidus or limitus. No tenderness palpation over interphalangeal joints. No pain with compression of the metatarsal heads. Neurovascularly intact distally.  Impression and Recommendations:    Plantar fasciitis, bilateral Did well 2 years ago with conservative measures for right plantar fasciitis, now having it on the left, needs to avoid barefoot walking, nighttime splint given. Rehab exercises, meloxicam at bedtime. Return to see me in 1 month, injection if no better. ___________________________________________ Gwen Her. Dianah Field, M.D., ABFM., CAQSM. Primary Care and [place] Instructor of [place] of [facility] of Medicine

## 2017-11-19 ENCOUNTER — Ambulatory Visit: Payer: BC Managed Care – PPO

## 2017-12-01 ENCOUNTER — Ambulatory Visit (INDEPENDENT_AMBULATORY_CARE_PROVIDER_SITE_OTHER): Payer: BC Managed Care – PPO

## 2017-12-01 DIAGNOSIS — Z1231 Encounter for screening mammogram for malignant neoplasm of breast: Secondary | ICD-10-CM | POA: Diagnosis not present

## 2017-12-13 ENCOUNTER — Ambulatory Visit: Payer: BC Managed Care – PPO | Admitting: Sports Medicine

## 2017-12-16 ENCOUNTER — Ambulatory Visit: Payer: BC Managed Care – PPO | Admitting: Sports Medicine

## 2017-12-30 ENCOUNTER — Ambulatory Visit: Payer: BC Managed Care – PPO | Admitting: Sports Medicine

## 2018-03-17 ENCOUNTER — Encounter: Payer: Self-pay | Admitting: Sports Medicine

## 2018-03-17 ENCOUNTER — Ambulatory Visit (INDEPENDENT_AMBULATORY_CARE_PROVIDER_SITE_OTHER): Payer: BC Managed Care – PPO | Admitting: Sports Medicine

## 2018-03-17 DIAGNOSIS — M170 Bilateral primary osteoarthritis of knee: Secondary | ICD-10-CM

## 2018-03-17 NOTE — Assessment & Plan Note (Signed)
Doing extremely well, having a slight recurrence of pain. Left knee injection as above, previous injection was 7 months ago. Has not yet done sleeve gastrectomy. We did do a knee MRI back in March.

## 2018-03-17 NOTE — Progress Notes (Signed)
Subjective:    CC: Knee pain  HPI: Vickie Castillo returns, for the past couple of weeks she is had worsening pain in her left knee, medial joint line, moderate, persistent without radiation or mechanical symptoms.   Her last injection was 7 months ago.  I reviewed the past medical history, family history, social history, surgical history, and allergies today and no changes were needed.  Please see the problem list section below in epic for further details.  Past Medical History: Past Medical History:  Diagnosis Date  . Fibroids   . Hypertensive retinopathy 08/12/2015   Per optometry records, see scanned docs  . Obesity   . 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 Seasonal allergies    Past Surgical History: Past Surgical History:  Procedure Laterality Date  . ABDOMINAL HYSTERECTOMY     PARTIAL  . CESAREAN SECTION    . KNEE SURGERY Left 04/05/2015  . MYOMECTOMY     Social History: Social History   Socioeconomic History  . Marital status: Married    Spouse name: Not on file  . Number of children: Not on file  . Years of education: Not on file  . Highest education level: Not on file  Occupational History  . Not on file  Social Needs  . Financial resource strain: Not on file  . Food insecurity:    Worry: Not on file    Inability: Not on file  . Transportation needs:    Medical: Not on file    Non-medical: Not on file  Tobacco Use  . Smoking status: Never Smoker  . Smokeless tobacco: Never Used  Substance and Sexual Activity  . Alcohol use: No    Alcohol/week: 0.0 standard drinks  . Drug use: No  . Sexual activity: Yes    Partners: Male    Birth control/protection: Surgical    Comment: Hysterectomy  Lifestyle  . Physical activity:    Days per week: Not on file    Minutes per session: Not on file  . Stress: Not on file  Relationships  . Social connections:    Talks on phone: Not on file    Gets together: Not on file    Attends  religious service: Not on file    Active member of club or organization: Not on file    Attends meetings of clubs or organizations: Not on file    Relationship status: Not on file  Other Topics Concern  . Not on file  Social History Narrative  . Not on file   Family History: Family History  Problem Relation Age of Onset  . Hyperlipidemia Mother   . Hypertension Mother   . Cancer Father 22       non hodgkins lymphoma/testicular  . Cancer Paternal Grandmother        unknown  . Heart disease Brother 45  . Diabetes Brother    Allergies: Allergies  Allergen Reactions  . Percocet [Oxycodone-Acetaminophen] Other (See Comments)    "makes me feel high, like I am floating" per pt   Medications: See med rec.  Review of Systems: No fevers, chills, night sweats, weight loss, chest pain, or shortness of breath.   Objective:    General: Well Developed, well nourished, and in no acute distress.  Neuro: Alert and oriented x3, extra-ocular muscles intact, sensation grossly intact.  HEENT: Normocephalic, atraumatic, pupils equal round reactive to light, neck supple, no masses, no lymphadenopathy, thyroid nonpalpable.  Skin: Warm and dry,  no rashes. Cardiac: Regular rate and rhythm, no murmurs rubs or gallops, no lower extremity edema.  Respiratory: Clear to auscultation bilaterally. Not using accessory muscles, speaking in full sentences. Left knee: Normal to inspection with no erythema or effusion or obvious bony abnormalities. Tender palpation of the medial joint line. ROM normal in flexion and extension and lower leg rotation. Ligaments with solid consistent endpoints including ACL, PCL, LCL, MCL. Negative Mcmurray's and provocative meniscal tests. Non painful patellar compression. Patellar and quadriceps tendons unremarkable. Hamstring and quadriceps strength is normal.  Procedure: Real-time Ultrasound Guided Injection of left knee Device: GE Logiq E  Verbal informed consent  obtained.  Time-out conducted.  Noted no overlying erythema, induration, or other signs of local infection.  Skin prepped in a sterile fashion.  Local anesthesia: Topical Ethyl chloride.  With sterile technique and under real time ultrasound guidance: 1 cc Kenalog 40, 2 cc lidocaine, 2 cc bupivacaine injected easily Completed without difficulty  Pain immediately resolved suggesting accurate placement of the medication.  Advised to call if fevers/chills, erythema, induration, drainage, or persistent bleeding.  Images permanently stored and available for review in the ultrasound unit.  Impression: Technically successful ultrasound guided injection.  Impression and Recommendations:    Primary osteoarthritis of both knees Doing extremely well, having a slight recurrence of pain. Left knee injection as above, previous injection was 7 months ago. Has not yet done sleeve gastrectomy. We did do a knee MRI back in March. ___________________________________________ Gwen Her. Dianah Field, M.D., ABFM., CAQSM. Primary Care and Sports Medicine [facility]  Adjunct Professor of [place] of [facility] of Medicine

## 2018-07-07 ENCOUNTER — Ambulatory Visit (INDEPENDENT_AMBULATORY_CARE_PROVIDER_SITE_OTHER): Payer: BC Managed Care – PPO | Admitting: Sports Medicine

## 2018-07-07 ENCOUNTER — Encounter: Payer: Self-pay | Admitting: Sports Medicine

## 2018-07-07 ENCOUNTER — Ambulatory Visit (INDEPENDENT_AMBULATORY_CARE_PROVIDER_SITE_OTHER): Payer: BC Managed Care – PPO

## 2018-07-07 DIAGNOSIS — M170 Bilateral primary osteoarthritis of knee: Secondary | ICD-10-CM | POA: Diagnosis not present

## 2018-07-07 DIAGNOSIS — M5416 Radiculopathy, lumbar region: Secondary | ICD-10-CM

## 2018-07-07 NOTE — Progress Notes (Signed)
Subjective:    CC: Knee pain, back pain  HPI: Left knee pain: History of left knee osteoarthritis with partial meniscectomy.  Continues to do well after left knee injection sometime ago.  Right knee pain: More recently has had increasing right knee pain, buckling, medial joint line discomfort.  Oral analgesics are not effective.  Right lumbar radiculopathy: Also with increasing pain going down the right side of the leg, buttock, thigh, calf.  Numbness and tingling in the bottom of the foot.  Is been years since her last L4 and L5 selective nerve root epidurals.  I reviewed the past medical history, family history, social history, surgical history, and allergies today and no changes were needed.  Please see the problem list section below in epic for further details.  Past Medical History: Past Medical History:  Diagnosis Date  . Fibroids   . Hypertensive retinopathy 08/12/2015   Per optometry records, see scanned docs  . Obesity   . 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 Seasonal allergies    Past Surgical History: Past Surgical History:  Procedure Laterality Date  . ABDOMINAL HYSTERECTOMY     PARTIAL  . CESAREAN SECTION    . KNEE SURGERY Left 04/05/2015  . MYOMECTOMY     Social History: Social History   Socioeconomic History  . Marital status: Married    Spouse name: Not on file  . Number of children: Not on file  . Years of education: Not on file  . Highest education level: Not on file  Occupational History  . Not on file  Social Needs  . Financial resource strain: Not on file  . Food insecurity:    Worry: Not on file    Inability: Not on file  . Transportation needs:    Medical: Not on file    Non-medical: Not on file  Tobacco Use  . Smoking status: Never Smoker  . Smokeless tobacco: Never Used  Substance and Sexual Activity  . Alcohol use: No    Alcohol/week: 0.0 standard drinks  . Drug use: No  . Sexual activity: Yes      Partners: Male    Birth control/protection: Surgical    Comment: Hysterectomy  Lifestyle  . Physical activity:    Days per week: Not on file    Minutes per session: Not on file  . Stress: Not on file  Relationships  . Social connections:    Talks on phone: Not on file    Gets together: Not on file    Attends religious service: Not on file    Active member of club or organization: Not on file    Attends meetings of clubs or organizations: Not on file    Relationship status: Not on file  Other Topics Concern  . Not on file  Social History Narrative  . Not on file   Family History: Family History  Problem Relation Age of Onset  . Hyperlipidemia Mother   . Hypertension Mother   . Cancer Father 25       non hodgkins lymphoma/testicular  . Cancer Paternal Grandmother        unknown  . Heart disease Brother 83  . Diabetes Brother    Allergies: Allergies  Allergen Reactions  . Percocet [Oxycodone-Acetaminophen] Other (See Comments)    "makes me feel high, like I am floating" per pt   Medications: See med rec.  Review of Systems: No fevers, chills, night sweats, weight loss, chest pain,  or shortness of breath.   Objective:    General: Well Developed, well nourished, and in no acute distress.  Neuro: Alert and oriented x3, extra-ocular muscles intact, sensation grossly intact.  HEENT: Normocephalic, atraumatic, pupils equal round reactive to light, neck supple, no masses, no lymphadenopathy, thyroid nonpalpable.  Skin: Warm and dry, no rashes. Cardiac: Regular rate and rhythm, no murmurs rubs or gallops, no lower extremity edema.  Respiratory: Clear to auscultation bilaterally. Not using accessory muscles, speaking in full sentences. Right knee: Normal to inspection with no erythema or effusion or obvious bony abnormalities. Tender to palpation at the medial joint line ROM normal in flexion and extension and lower leg rotation. Ligaments with solid consistent  endpoints including ACL, PCL, LCL, MCL. Negative Mcmurray's and provocative meniscal tests. Non painful patellar compression. Patellar and quadriceps tendons unremarkable. Hamstring and quadriceps strength is normal.  Procedure: Real-time Ultrasound Guided Injection of right knee Device: GE Logiq E  Verbal informed consent obtained.  Time-out conducted.  Noted no overlying erythema, induration, or other signs of local infection.  Skin prepped in a sterile fashion.  Local anesthesia: Topical Ethyl chloride.  With sterile technique and under real time ultrasound guidance: 1 cc Kenalog 40, 2 cc lidocaine, 2 cc bupivacaine injected easily Completed without difficulty  Pain immediately resolved suggesting accurate placement of the medication.  Advised to call if fevers/chills, erythema, induration, drainage, or persistent bleeding.  Images permanently stored and available for review in the ultrasound unit.  Impression: Technically successful ultrasound guided injection.  Impression and Recommendations:    Primary osteoarthritis of both knees Partial meniscectomy on the left, has done well with an injection, the most recent of which was in October. Having similar symptoms on the right, medial joint line pain, occasional buckling. X-rays, injection. MRI if no better in 1 month. Rehab exercises given.  Right lumbar radiculopathy Extremely well in the past with right L4 and L5 selective epidurals. Most recent one was about 2 years ago. I am going to inject her knee. If she has good relief of her posterior thigh cramping to me know this was all coming from the knee, if not we will set her up for right L4 and L5 selective epidurals as well.  At the end of visit she was noticing increasing cramping down the back of her right leg consistent with lumbar radiculopathy secondary to a straight leg raise on the exam table. I am going to go ahead and set her up for the  epidurals. ___________________________________________ Gwen Her. Dianah Field, M.D., ABFM., CAQSM. Primary Care and Sports Medicine [facility]  Adjunct Professor of [place] of [facility] of Medicine

## 2018-07-07 NOTE — Addendum Note (Signed)
Addended by: Silverio Decamp on: 07/07/2018 04:49 PM   Modules accepted: Orders

## 2018-07-07 NOTE — Assessment & Plan Note (Signed)
Partial meniscectomy on the left, has done well with an injection, the most recent of which was in October. Having similar symptoms on the right, medial joint line pain, occasional buckling. X-rays, injection. MRI if no better in 1 month. Rehab exercises given.

## 2018-07-07 NOTE — Assessment & Plan Note (Addendum)
Extremely well in the past with right L4 and L5 selective epidurals. Most recent one was about 2 years ago. I am going to inject her knee. If she has good relief of her posterior thigh cramping to me know this was all coming from the knee, if not we will set her up for right L4 and L5 selective epidurals as well.  At the end of visit she was noticing increasing cramping down the back of her right leg consistent with lumbar radiculopathy secondary to a straight leg raise on the exam table. I am going to go ahead and set her up for the epidurals.

## 2018-07-11 ENCOUNTER — Telehealth: Payer: Self-pay | Admitting: *Deleted

## 2018-07-11 DIAGNOSIS — M25561 Pain in right knee: Secondary | ICD-10-CM

## 2018-07-11 DIAGNOSIS — M170 Bilateral primary osteoarthritis of knee: Secondary | ICD-10-CM

## 2018-07-11 DIAGNOSIS — G8929 Other chronic pain: Secondary | ICD-10-CM

## 2018-07-11 NOTE — Telephone Encounter (Signed)
MRI ordered

## 2018-07-11 NOTE — Telephone Encounter (Signed)
Pt left vm stating that the injection you gave her last week has not helped at all and now she is experiencing some buckling.  She is ready to proceed with and MRI.

## 2018-07-12 NOTE — Telephone Encounter (Signed)
LMOM notifying pt of MRI order.

## 2018-07-20 ENCOUNTER — Other Ambulatory Visit: Payer: BC Managed Care – PPO

## 2018-07-22 ENCOUNTER — Ambulatory Visit
Admission: RE | Admit: 2018-07-22 | Discharge: 2018-07-22 | Disposition: A | Discharge status: Home / Self Care | Payer: BC Managed Care – PPO | Source: Ambulatory Visit | Attending: Sports Medicine | Admitting: Sports Medicine

## 2018-07-22 ENCOUNTER — Other Ambulatory Visit: Payer: Self-pay | Admitting: Sports Medicine

## 2018-07-22 DIAGNOSIS — M5416 Radiculopathy, lumbar region: Secondary | ICD-10-CM

## 2018-07-22 MED ORDER — METHYLPREDNISOLONE ACETATE 40 MG/ML INJ SUSP (RADIOLOG
120.0000 mg | Freq: Once | INTRAMUSCULAR | Status: AC
  Administered 2018-07-22: 120 mg via EPIDURAL

## 2018-07-22 MED ORDER — IOPAMIDOL (ISOVUE-M 200) INJECTION 41%
1.0000 mL | Freq: Once | INTRAMUSCULAR | Status: AC
  Administered 2018-07-22: 1 mL via EPIDURAL

## 2018-07-22 NOTE — Discharge Instructions (Signed)

## 2018-07-25 ENCOUNTER — Ambulatory Visit (INDEPENDENT_AMBULATORY_CARE_PROVIDER_SITE_OTHER): Payer: BC Managed Care – PPO

## 2018-07-25 DIAGNOSIS — G8929 Other chronic pain: Secondary | ICD-10-CM

## 2018-07-25 DIAGNOSIS — M25461 Effusion, right knee: Secondary | ICD-10-CM

## 2018-07-25 DIAGNOSIS — M660 Rupture of popliteal cyst: Secondary | ICD-10-CM | POA: Diagnosis not present

## 2018-07-25 DIAGNOSIS — M23221 Derangement of posterior horn of medial meniscus due to old tear or injury, right knee: Secondary | ICD-10-CM

## 2018-07-25 DIAGNOSIS — M25561 Pain in right knee: Secondary | ICD-10-CM

## 2018-07-25 DIAGNOSIS — M170 Bilateral primary osteoarthritis of knee: Secondary | ICD-10-CM

## 2018-07-28 ENCOUNTER — Ambulatory Visit (INDEPENDENT_AMBULATORY_CARE_PROVIDER_SITE_OTHER): Payer: BC Managed Care – PPO | Admitting: Sports Medicine

## 2018-07-28 ENCOUNTER — Encounter: Payer: Self-pay | Admitting: Sports Medicine

## 2018-07-28 DIAGNOSIS — M170 Bilateral primary osteoarthritis of knee: Secondary | ICD-10-CM

## 2018-07-28 DIAGNOSIS — M5416 Radiculopathy, lumbar region: Secondary | ICD-10-CM

## 2018-07-28 MED ORDER — TRAMADOL HCL 50 MG PO TABS: 50.0000 mg | ORAL_TABLET | Freq: Three times a day (TID) | ORAL | 0 refills | Status: DC | PRN

## 2018-07-28 MED ORDER — GABAPENTIN 300 MG PO CAPS: ORAL_CAPSULE | ORAL | 3 refills | Status: DC

## 2018-07-28 NOTE — Assessment & Plan Note (Signed)
History of partial meniscectomy on the left with Dr. Mayer Camel and feels orthopedics. Injections have not been efficacious, worsening pain on the right, occasional buckling, we did an injection about a month ago. She did not have any relief so we obtained an MRI which shows multiple internal derangements, including meniscal tearing, osteoarthritis and areas of full-thickness cartilage loss. At this point I do think we need operative intervention, we did discuss the limitations of arthroscopy with arthroscopic debridement in patients with osteoarthritis, but I do think is worth a try, she would prefer to stay in [place] so we are doing a referral to [place]. Adding tramadol to use in the meantime.

## 2018-07-28 NOTE — Progress Notes (Signed)
Subjective:    CC: Follow-up  HPI: Vickie Castillo returns, she is a pleasant 48 year old female with knee osteoarthritis, we have treated her conservatively, left and right.  More recently she was having severe pain in her right knee, we did an injection, unfortunately she did not improve, she was having mechanical symptoms at that time.  The MRI was obtained and the results of which will be dictated below.  Lumbar radiculopathy: Recently post L4 and L5 selective epidurals.  I reviewed the past medical history, family history, social history, surgical history, and allergies today and no changes were needed.  Please see the problem list section below in epic for further details.  Past Medical History: Past Medical History:  Diagnosis Date  . Fibroids   . Hypertensive retinopathy 08/12/2015   Per optometry records, see scanned docs  . Obesity   . 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 Seasonal allergies    Past Surgical History: Past Surgical History:  Procedure Laterality Date  . ABDOMINAL HYSTERECTOMY     PARTIAL  . CESAREAN SECTION    . KNEE SURGERY Left 04/05/2015  . MYOMECTOMY     Social History: Social History   Socioeconomic History  . Marital status: Married    Spouse name: Not on file  . Number of children: Not on file  . Years of education: Not on file  . Highest education level: Not on file  Occupational History  . Not on file  Social Needs  . Financial resource strain: Not on file  . Food insecurity:    Worry: Not on file    Inability: Not on file  . Transportation needs:    Medical: Not on file    Non-medical: Not on file  Tobacco Use  . Smoking status: Never Smoker  . Smokeless tobacco: Never Used  Substance and Sexual Activity  . Alcohol use: No    Alcohol/week: 0.0 standard drinks  . Drug use: No  . Sexual activity: Yes    Partners: Male    Birth control/protection: Surgical    Comment: Hysterectomy  Lifestyle  .  Physical activity:    Days per week: Not on file    Minutes per session: Not on file  . Stress: Not on file  Relationships  . Social connections:    Talks on phone: Not on file    Gets together: Not on file    Attends religious service: Not on file    Active member of club or organization: Not on file    Attends meetings of clubs or organizations: Not on file    Relationship status: Not on file  Other Topics Concern  . Not on file  Social History Narrative  . Not on file   Family History: Family History  Problem Relation Age of Onset  . Hyperlipidemia Mother   . Hypertension Mother   . Cancer Father 8       non hodgkins lymphoma/testicular  . Cancer Paternal Grandmother        unknown  . Heart disease Brother 50  . Diabetes Brother    Allergies: Allergies  Allergen Reactions  . Percocet [Oxycodone-Acetaminophen] Other (See Comments)    "makes me feel high, like I am floating" per pt   Medications: See med rec.  Review of Systems: No fevers, chills, night sweats, weight loss, chest pain, or shortness of breath.   Objective:    General: Well Developed, well nourished, and in no acute distress.  Neuro: Alert and oriented x3, extra-ocular muscles intact, sensation grossly intact.  HEENT: Normocephalic, atraumatic, pupils equal round reactive to light, neck supple, no masses, no lymphadenopathy, thyroid nonpalpable.  Skin: Warm and dry, no rashes. Cardiac: Regular rate and rhythm, no murmurs rubs or gallops, no lower extremity edema.  Respiratory: Clear to auscultation bilaterally. Not using accessory muscles, speaking in full sentences.  Impression and Recommendations:    Primary osteoarthritis of both knees History of partial meniscectomy on the left with Dr. Mayer Camel and [facility]. Injections have not been efficacious, worsening pain on the right, occasional buckling, we did an injection about a month ago. She did not have any relief so we obtained an MRI  which shows multiple internal derangements, including meniscal tearing, osteoarthritis and areas of full-thickness cartilage loss. At this point I do think we need operative intervention, we did discuss the limitations of arthroscopy with arthroscopic debridement in patients with osteoarthritis, but I do think is worth a try, she would prefer to stay in [place] so we are doing a referral to [place]. Adding tramadol to use in the meantime.  Right lumbar radiculopathy Recently had a right L4 and L5 selective epidurals about 6 days ago. Previously it was done 2 years before. She is having some cramping in the back of her legs, not much better since her epidural though it is still early. I do think some of it may be coming from a primary knee process. Adding gabapentin to use at night. ___________________________________________ Gwen Her. Dianah Field, M.D., ABFM., CAQSM. Primary Care and Sports Medicine [facility]  Adjunct Professor of [employer]

## 2018-07-28 NOTE — Assessment & Plan Note (Signed)
Recently had a right L4 and L5 selective epidurals about 6 days ago. Previously it was done 2 years before. She is having some cramping in the back of her legs, not much better since her epidural though it is still early. I do think some of it may be coming from a primary knee process. Adding gabapentin to use at night.

## 2018-07-29 ENCOUNTER — Telehealth: Payer: Self-pay | Admitting: Sports Medicine

## 2018-07-29 NOTE — Telephone Encounter (Signed)
Pt called clinic stating she cannot take the tramadol. It causes her to have N/V. Chart updated. She will take Advil PRN.   Has not started the gabapentin yet, she will start that tomorrow.

## 2018-07-29 NOTE — Telephone Encounter (Signed)
Noted, thank you

## 2018-08-04 ENCOUNTER — Ambulatory Visit: Payer: BC Managed Care – PPO | Admitting: Sports Medicine

## 2019-01-13 ENCOUNTER — Telehealth: Payer: Self-pay | Admitting: Sports Medicine

## 2019-01-13 ENCOUNTER — Ambulatory Visit: Payer: BC Managed Care – PPO | Admitting: Sports Medicine

## 2019-01-13 DIAGNOSIS — M170 Bilateral primary osteoarthritis of knee: Secondary | ICD-10-CM

## 2019-01-13 NOTE — Assessment & Plan Note (Signed)
History of partial meniscectomy on the left with Dr. Mayer Camel and [facility]. Injections have not been efficacious, worsening pain on the right, occasional buckling, we did an injection about 2 months ago. She did not have any relief so we obtained an MRI which shows multiple internal derangements, including meniscal tearing, osteoarthritis and areas of full-thickness cartilage loss. At this point I do think we need operative intervention, we did discuss the limitations of arthroscopy with arthroscopic debridement in patients with osteoarthritis, but I do think is worth a try, she would prefer to stay in [place] so we are doing a referral to [place]. Adding tramadol to use in the meantime.  Update after 1 month of tramadol, Vickie Castillo's knee is great, she does want some custom orthotics so I will do a referral to Dr. Raeford Razor.

## 2019-01-13 NOTE — Telephone Encounter (Signed)
Osie's knee is great, she does want some custom orthotics so I will do a referral to Dr. Raeford Razor.  We have canceled her appointment with me today, she does not need to be seen.

## 2019-01-23 ENCOUNTER — Telehealth: Payer: Self-pay | Admitting: *Deleted

## 2019-01-23 NOTE — Telephone Encounter (Signed)
Returned call from 2:34pm and 3:27pm. Left patient a message to call and schedule appointment.

## 2019-01-24 ENCOUNTER — Other Ambulatory Visit: Payer: Self-pay | Admitting: Internal Medicine

## 2019-01-24 DIAGNOSIS — Z1231 Encounter for screening mammogram for malignant neoplasm of breast: Secondary | ICD-10-CM

## 2019-02-06 ENCOUNTER — Ambulatory Visit: Payer: BC Managed Care – PPO | Admitting: Obstetrics & Gynecology

## 2019-02-06 ENCOUNTER — Telehealth: Payer: Self-pay | Admitting: *Deleted

## 2019-02-06 NOTE — Telephone Encounter (Signed)
Left patient a message that appointment has been rescheduled for 03/02/2019 at 4:00pm.

## 2019-02-09 ENCOUNTER — Ambulatory Visit: Payer: BC Managed Care – PPO | Admitting: Obstetrics & Gynecology

## 2019-02-28 ENCOUNTER — Encounter: Payer: BC Managed Care – PPO | Admitting: Family Medicine

## 2019-02-28 NOTE — Progress Notes (Deleted)
Vickie Castillo - 48 y.o. female MRN [id number]  Date of birth: Oct 18, 1970  SUBJECTIVE:  Including CC & ROS.  No chief complaint on file.   Vickie Castillo is a 48 y.o. female that is  ***.  ***   Review of Systems  HISTORY: Past Medical, Surgical, Social, and Family History Reviewed & Updated per EMR.   Pertinent Historical Findings include:  Past Medical History:  Diagnosis Date  . Fibroids   . Hypertensive retinopathy 08/12/2015   Per optometry records, see scanned docs  . Obesity   . 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 Seasonal allergies     Past Surgical History:  Procedure Laterality Date  . ABDOMINAL HYSTERECTOMY     PARTIAL  . CESAREAN SECTION    . KNEE SURGERY Left 04/05/2015  . MYOMECTOMY      Allergies  Allergen Reactions  . Oxycodone-Acetaminophen Other (See Comments)    "makes me feel high, like I am floating" per pt "makes me feel high, like I am floating" per pt   . Tramadol Nausea And Vomiting  . Percocet [Oxycodone-Acetaminophen] Other (See Comments)    "makes me feel high, like I am floating" per pt    Family History  Problem Relation Age of Onset  . Hyperlipidemia Mother   . Hypertension Mother   . Cancer Father 70       non hodgkins lymphoma/testicular  . Cancer Paternal Grandmother        unknown  . Heart disease Brother 59  . Diabetes Brother      Social History   Socioeconomic History  . Marital status: Married    Spouse name: Not on file  . Number of children: Not on file  . Years of education: Not on file  . Highest education level: Not on file  Occupational History  . Not on file  Social Needs  . Financial resource strain: Not on file  . Food insecurity    Worry: Not on file    Inability: Not on file  . Transportation needs    Medical: Not on file    Non-medical: Not on file  Tobacco Use  . Smoking status: Never Smoker  . Smokeless tobacco: Never Used  Substance and Sexual  Activity  . Alcohol use: No    Alcohol/week: 0.0 standard drinks  . Drug use: No  . Sexual activity: Yes    Partners: Male    Birth control/protection: Surgical    Comment: Hysterectomy  Lifestyle  . Physical activity    Days per week: Not on file    Minutes per session: Not on file  . Stress: Not on file  Relationships  . Social Herbalist on phone: Not on file    Gets together: Not on file    Attends religious service: Not on file    Active member of club or organization: Not on file    Attends meetings of clubs or organizations: Not on file    Relationship status: Not on file  . Intimate partner violence    Fear of current or ex partner: Not on file    Emotionally abused: Not on file    Physically abused: Not on file    Forced sexual activity: Not on file  Other Topics Concern  . Not on file  Social History Narrative  . Not on file     PHYSICAL EXAM:  VS: There were no vitals taken  for this visit. Physical Exam Gen: NAD, alert, cooperative with exam, well-appearing ENT: normal lips, normal nasal mucosa,  Eye: normal EOM, normal conjunctiva and lids CV:  no edema, +2 pedal pulses   Resp: no accessory muscle use, non-labored,  GI: no masses or tenderness, no hernia  Skin: no rashes, no areas of induration  Neuro: normal tone, normal sensation to touch Psych:  normal insight, alert and oriented MSK:  ***      ASSESSMENT & PLAN:   No problem-specific Assessment & Plan notes found for this encounter.

## 2019-03-01 ENCOUNTER — Ambulatory Visit (INDEPENDENT_AMBULATORY_CARE_PROVIDER_SITE_OTHER): Payer: BC Managed Care – PPO

## 2019-03-01 ENCOUNTER — Other Ambulatory Visit: Payer: Self-pay

## 2019-03-01 ENCOUNTER — Ambulatory Visit: Payer: BC Managed Care – PPO

## 2019-03-01 DIAGNOSIS — Z1231 Encounter for screening mammogram for malignant neoplasm of breast: Secondary | ICD-10-CM | POA: Diagnosis not present

## 2019-03-02 ENCOUNTER — Ambulatory Visit: Payer: BC Managed Care – PPO | Admitting: Obstetrics & Gynecology

## 2019-03-02 ENCOUNTER — Telehealth: Payer: Self-pay | Admitting: *Deleted

## 2019-03-02 DIAGNOSIS — M170 Bilateral primary osteoarthritis of knee: Secondary | ICD-10-CM

## 2019-03-02 NOTE — Telephone Encounter (Signed)
Pt left a vm asking for a new ortho referral for her knees.  You put one in back in March but then there was Covid.  She would like for you to refer her to someone that YOU prefer.

## 2019-03-02 NOTE — Telephone Encounter (Signed)
Referral placed to Dr. Graves. 

## 2019-03-07 ENCOUNTER — Other Ambulatory Visit: Payer: Self-pay

## 2019-03-07 ENCOUNTER — Ambulatory Visit: Payer: BC Managed Care – PPO | Admitting: Family Medicine

## 2019-03-07 ENCOUNTER — Encounter: Payer: Self-pay | Admitting: Family Medicine

## 2019-03-07 DIAGNOSIS — M170 Bilateral primary osteoarthritis of knee: Secondary | ICD-10-CM | POA: Diagnosis not present

## 2019-03-07 NOTE — Assessment & Plan Note (Signed)
Pain acute on chronic in nature. Has had improvement of her pain previously with custom orthotics - counseled on supportive care - orthotics today  - f/ PRN

## 2019-03-07 NOTE — Progress Notes (Signed)
Vickie Castillo - 48 y.o. female MRN [id number]  Date of birth: 1970/08/20  SUBJECTIVE:  Including CC & ROS.  Chief Complaint  Patient presents with  . Foot Orthotics    Vickie Castillo is a 48 y.o. female that is presenting with bilateral knee pain.  The pain is acute on chronic in nature.  She is tried several injections and other conservative measures.  She is used orthotics in the past and those seem to have improved her knee pain.  She lost her previous set.  The knee pain tends to be localized to the knee.  Denies any radicular symptoms.  No mechanical symptoms.  Pain is intermittent in nature.   Review of Systems  Constitutional: Negative for fever.  HENT: Negative for congestion.   Respiratory: Negative for cough.   Cardiovascular: Negative for chest pain.  Gastrointestinal: Negative for abdominal pain.  Musculoskeletal: Positive for arthralgias.  Skin: Negative for color change.  Neurological: Negative for weakness.  Hematological: Negative for adenopathy.    HISTORY: Past Medical, Surgical, Social, and Family History Reviewed & Updated per EMR.   Pertinent Historical Findings include:  Past Medical History:  Diagnosis Date  . Fibroids   . Hypertensive retinopathy 08/12/2015   Per optometry records, see scanned docs  . Obesity   . 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 Seasonal allergies     Past Surgical History:  Procedure Laterality Date  . ABDOMINAL HYSTERECTOMY     PARTIAL  . CESAREAN SECTION    . KNEE SURGERY Left 04/05/2015  . MYOMECTOMY      Allergies  Allergen Reactions  . Oxycodone-Acetaminophen Other (See Comments)    "makes me feel high, like I am floating" per pt "makes me feel high, like I am floating" per pt   . Tramadol Nausea And Vomiting  . Percocet [Oxycodone-Acetaminophen] Other (See Comments)    "makes me feel high, like I am floating" per pt    Family History  Problem Relation Age of Onset  .  Hyperlipidemia Mother   . Hypertension Mother   . Cancer Father 12       non hodgkins lymphoma/testicular  . Cancer Paternal Grandmother        unknown  . Heart disease Brother 54  . Diabetes Brother      Social History   Socioeconomic History  . Marital status: Married    Spouse name: Not on file  . Number of children: Not on file  . Years of education: Not on file  . Highest education level: Not on file  Occupational History  . Not on file  Social Needs  . Financial resource strain: Not on file  . Food insecurity    Worry: Not on file    Inability: Not on file  . Transportation needs    Medical: Not on file    Non-medical: Not on file  Tobacco Use  . Smoking status: Never Smoker  . Smokeless tobacco: Never Used  Substance and Sexual Activity  . Alcohol use: No    Alcohol/week: 0.0 standard drinks  . Drug use: No  . Sexual activity: Yes    Partners: Male    Birth control/protection: Surgical    Comment: Hysterectomy  Lifestyle  . Physical activity    Days per week: Not on file    Minutes per session: Not on file  . Stress: Not on file  Relationships  . Social connections    Talks  on phone: Not on file    Gets together: Not on file    Attends religious service: Not on file    Active member of club or organization: Not on file    Attends meetings of clubs or organizations: Not on file    Relationship status: Not on file  . Intimate partner violence    Fear of current or ex partner: Not on file    Emotionally abused: Not on file    Physically abused: Not on file    Forced sexual activity: Not on file  Other Topics Concern  . Not on file  Social History Narrative  . Not on file     PHYSICAL EXAM:  VS: BP 125/77   Pulse 80   Ht 5\' 4"  (1.626 m)   BMI 39.82 kg/m  Physical Exam Gen: NAD, alert, cooperative with exam, well-appearing ENT: normal lips, normal nasal mucosa,  Eye: normal EOM, normal conjunctiva and lids CV:  no edema, +2 pedal pulses    Resp: no accessory muscle use, non-labored,  Skin: no rashes, no areas of induration  Neuro: normal tone, normal sensation to touch Psych:  normal insight, alert and oriented MSK:  Left and Right knee: No obvious effusion  Normal ROM  Normal strength to resistance  Left and right foot:  Longitudinal arch is fairly neutral  Has a wide forefoot  NVI   Patient was fitted for a standard, cushioned, semi-rigid orthotic. The orthotic was heated and afterward the patient stood on the orthotic blank positioned on the orthotic stand. The patient was positioned in subtalar neutral position and 10 degrees of ankle dorsiflexion in a weight bearing stance. After completion of molding, a stable base was applied to the orthotic blank. The blank was ground to a stable position for weight bearing. Size: 10 Base: Blue EVA Additional Posting and Padding: None The patient ambulated these, and they were very comfortable.    ASSESSMENT & PLAN:   Primary osteoarthritis of both knees Pain acute on chronic in nature. Has had improvement of her pain previously with custom orthotics - counseled on supportive care - orthotics today  - f/ PRN

## 2019-03-08 ENCOUNTER — Telehealth: Payer: Self-pay | Admitting: *Deleted

## 2019-03-08 DIAGNOSIS — M5416 Radiculopathy, lumbar region: Secondary | ICD-10-CM

## 2019-03-08 NOTE — Telephone Encounter (Signed)
Referral placed to the spine surgeon at that facility.

## 2019-03-08 NOTE — Telephone Encounter (Signed)
Pt left vm yesterday evening stating that she did not need a referral for her knee, she has that taken care of at [facility].  What she needed was a referral for her spine.  She would like to see someone in the same [place] office.

## 2019-11-24 ENCOUNTER — Emergency Department
Admission: EM | Admit: 2019-11-24 | Discharge: 2019-11-24 | Disposition: A | Discharge status: Home / Self Care | Payer: BC Managed Care – PPO | Source: Home / Self Care

## 2019-11-24 ENCOUNTER — Other Ambulatory Visit: Payer: Self-pay

## 2019-11-24 DIAGNOSIS — J020 Streptococcal pharyngitis: Secondary | ICD-10-CM

## 2019-11-24 DIAGNOSIS — J039 Acute tonsillitis, unspecified: Secondary | ICD-10-CM

## 2019-11-24 LAB — POCT RAPID STREP A (OFFICE): Rapid Strep A Screen: POSITIVE — AB

## 2019-11-24 LAB — POCT MONO SCREEN (KUC): Mono, POC: NEGATIVE

## 2019-11-24 MED ORDER — PREDNISONE 50 MG PO TABS: 50.0000 mg | ORAL_TABLET | Freq: Every day | ORAL | 0 refills | Status: AC

## 2019-11-24 MED ORDER — CEFDINIR 300 MG PO CAPS: 300.0000 mg | ORAL_CAPSULE | Freq: Two times a day (BID) | ORAL | 0 refills | Status: AC

## 2019-11-24 NOTE — ED Provider Notes (Signed)
Vickie Castillo CARE    CSN: [id number] Arrival date & time: 11/24/19  1747      History   Chief Complaint Chief Complaint  Patient presents with  . Sore Throat    HPI Vickie Castillo is a 49 y.o. female.   HPI Vickie Castillo is a 49 y.o. female presenting to UC with c/o sore throat, generalized HA, and swollen lymph nodes that started last night.  She was tx on June 16th with a 10 day course of Augmentin for suspected strep throat and bilateral AOM.  Symptoms had resolved until last night. Denies ear pain.  She notes her son tested positive for strep throat recently.  Per medical records, strep culture from June 16th was negative. Pt denies hx of mono. She received the Covid-19 vaccine a few months ago. No cough or congestion. No known exposure to Covid.    Past Medical History:  Diagnosis Date  . Fibroids   . Hypertensive retinopathy 08/12/2015   Per optometry records, see scanned docs  . Obesity   . 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 Seasonal allergies     Patient Active Problem List   Diagnosis Date Noted  . Sacroiliac joint pain 06/03/2017  . Chronic sciatica 06/03/2017  . Allergic rhinitis 06/03/2017  . Acute upper respiratory infection 06/03/2017  . Vitamin D deficiency 06/03/2017  . H/O: hysterectomy 11/12/2016  . Prediabetes 09/22/2016  . Plantar fasciitis, bilateral 02/20/2016  . Nausea without vomiting 10/31/2015  . Acute ear pain 10/31/2015  . 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 Hypertensive retinopathy 08/12/2015  . Right lumbar radiculopathy 01/14/2015  . Primary osteoarthritis of both knees 01/14/2015  . Morbid obesity with BMI of 40.0-44.9, adult ([place])   . OSA on CPAP 03/02/2012  . Chest pain 08/28/2011  . Endometriosis 12/26/2010    Past Surgical History:  Procedure Laterality Date  . ABDOMINAL HYSTERECTOMY     PARTIAL  . CESAREAN SECTION    . KNEE SURGERY Left 04/05/2015  . MYOMECTOMY      OB  History    Gravida  5   Para  4   Term  4   Preterm      AB  1   Living  4     SAB  1   TAB      Ectopic      Multiple      Live Births  4            Home Medications    Prior to Admission medications   Medication Sig Start Date End Date Taking? Authorizing Provider  AMBULATORY NON FORMULARY MEDICATION Supply ordered: CPAP and other supplies needed (headgear, cushions, filters, heated tuubing and water chamber) Dx: obstructive sleep apnea Settings: auto-titration 09/30/16   Emeterio Reeve, DO  cefdinir (OMNICEF) 300 MG capsule Take 1 capsule (300 mg total) by mouth 2 (two) times daily for 10 days. 11/24/19 12/04/19  Noe Gens, PA-C  cetirizine (ZYRTEC) 10 MG tablet Take 10 mg by mouth daily.    [provider]  gabapentin (NEURONTIN) 300 MG capsule One tab PO qHS for a week, then BID for a week, then TID. May double weekly to a max of 3,600mg /day 07/28/18   Silverio Decamp, MD  Multiple Vitamin (MULTIVITAMIN) tablet Take by mouth.    [provider]  predniSONE (DELTASONE) 50 MG tablet Take 1 tablet (50 mg total) by mouth daily with breakfast for 5 days.  11/24/19 11/29/19  Noe Gens, PA-C  VITAMIN D PO Take 5,000 Int'l Units by mouth daily.    [provider]    Family History Family History  Problem Relation Age of Onset  . Hyperlipidemia Mother   . Hypertension Mother   . Cancer Father 59       non hodgkins lymphoma/testicular  . Cancer Paternal Grandmother        unknown  . Heart disease Brother 61  . Diabetes Brother     Social History Social History   Tobacco Use  . Smoking status: Never Smoker  . Smokeless tobacco: Never Used  Substance Use Topics  . Alcohol use: No    Alcohol/week: 0.0 standard drinks  . Drug use: No     Allergies   Oxycodone-acetaminophen, Tramadol, and Percocet [oxycodone-acetaminophen]   Review of Systems Review of Systems  Constitutional: Positive for fatigue. Negative for  chills and fever.  HENT: Positive for sore throat. Negative for congestion, ear pain, trouble swallowing and voice change.   Respiratory: Negative for cough and shortness of breath.   Cardiovascular: Negative for chest pain and palpitations.  Gastrointestinal: Negative for abdominal pain, diarrhea, nausea and vomiting.  Musculoskeletal: Negative for arthralgias, back pain and myalgias.  Skin: Negative for rash.  Neurological: Positive for headaches. Negative for dizziness and light-headedness.  All other systems reviewed and are negative.    Physical Exam Triage Vital Signs ED Triage Vitals  Enc Vitals Group     BP 11/24/19 1801 127/83     Pulse Rate 11/24/19 1801 69     Resp 11/24/19 1801 14     Temp 11/24/19 1801 99.1 F (37.3 C)     Temp Source 11/24/19 1801 Oral     SpO2 11/24/19 1801 97 %     Weight --      Height --      Head Circumference --      Peak Flow --      Pain Score 11/24/19 1759 5     Pain Loc --      Pain Edu? --      Excl. in [employer]? --    No data found.  Updated Vital Signs BP 127/83 (BP Location: Right Arm)   Pulse 69   Temp 99.1 F (37.3 C) (Oral)   Resp 14   SpO2 97%   Visual Acuity Right Eye Distance:   Left Eye Distance:   Bilateral Distance:    Right Eye Near:   Left Eye Near:    Bilateral Near:     Physical Exam Vitals and nursing note reviewed.  Constitutional:      Appearance: She is well-developed.  HENT:     Head: Normocephalic and atraumatic.     Right Ear: Tympanic membrane and ear canal normal.     Left Ear: Tympanic membrane and ear canal normal.     Nose: Nose normal.     Right Sinus: No maxillary sinus tenderness or frontal sinus tenderness.     Left Sinus: No maxillary sinus tenderness or frontal sinus tenderness.     Mouth/Throat:     Lips: Pink.     Mouth: Mucous membranes are moist.     Pharynx: Oropharynx is clear. Uvula midline. Posterior oropharyngeal erythema present. No pharyngeal swelling, oropharyngeal  exudate or uvula swelling.     Tonsils: Tonsillar exudate present. No tonsillar abscesses. 3+ on the right. 3+ on the left.  Cardiovascular:     Rate and Rhythm: Normal  rate and regular rhythm.  Pulmonary:     Effort: Pulmonary effort is normal. No respiratory distress.     Breath sounds: Normal breath sounds. No stridor. No wheezing, rhonchi or rales.  Musculoskeletal:        General: Normal range of motion.     Cervical back: Normal range of motion and neck supple.  Lymphadenopathy:     Cervical: Cervical adenopathy present.  Skin:    General: Skin is warm and dry.  Neurological:     Mental Status: She is alert and oriented to person, place, and time.  Psychiatric:        Behavior: Behavior normal.      UC Treatments / Results  Labs (all labs ordered are listed, but only abnormal results are displayed) Labs Reviewed  POCT RAPID STREP A (OFFICE) - Abnormal; Notable for the following components:      Result Value   Rapid Strep A Screen Positive (*)    All other components within normal limits  POCT MONO SCREEN ([facility])    EKG   Radiology No results found.  Procedures Procedures (including critical care time)  Medications Ordered in UC Medications - No data to display  Initial Impression / Assessment and Plan / UC Course  I have reviewed the triage vital signs and the nursing notes.  Pertinent labs & imaging results that were available during my care of the patient were reviewed by me and considered in my medical decision making (see chart for details).     Rapid strep: POSITIVE Will have pt start cefdinir BID for 10 days F/u with PCP in 1 week if needed. AVS provided  Final Clinical Impressions(s) / UC Diagnoses   Final diagnoses:  Acute tonsillitis, unspecified etiology  Strep pharyngitis     Discharge Instructions      Your strep test was positive today. You are being started on a different antibiotic, cefdinir (omnicef) to take twice daily for 10  days.  Be sure to complete the entire 10 days of antibiotic to help make sure the infection does not return.  Be sure to change out your toothbrush after being on the antibiotic for 24-48 hours to help prevent re-infecting yourself.  Do not share food, drink or utensils with others to also help prevent the spread and re-infection of strep.  You may take 500mg  acetaminophen every 4-6 hours or in combination with ibuprofen 400-600mg  every 6-8 hours as needed for pain, inflammation, and fever.  Be sure to well hydrated with clear liquids and get at least 8 hours of sleep at night, preferably more while sick.   Please follow up with family medicine in 1 week if needed.     ED Prescriptions    Medication Sig Dispense Auth. Provider   cefdinir (OMNICEF) 300 MG capsule Take 1 capsule (300 mg total) by mouth 2 (two) times daily for 10 days. 20 capsule Gerarda Fraction, Laporchia Nakajima O, PA-C   predniSONE (DELTASONE) 50 MG tablet Take 1 tablet (50 mg total) by mouth daily with breakfast for 5 days. 5 tablet Noe Gens, PA-C     PDMP not reviewed this encounter.   Noe Gens, [place] 11/24/19 1950

## 2019-11-24 NOTE — ED Triage Notes (Signed)
Patient had strep and double ear infections June 16. Given 10 days of augmentin. Last night headache, sore throat, swollen lymph nodes. Denies known sick contacts. Vaccinated in April with [employer].

## 2019-11-24 NOTE — Discharge Instructions (Addendum)
  Your strep test was positive today. You are being started on a different antibiotic, cefdinir (omnicef) to take twice daily for 10 days.  Be sure to complete the entire 10 days of antibiotic to help make sure the infection does not return.  Be sure to change out your toothbrush after being on the antibiotic for 24-48 hours to help prevent re-infecting yourself.  Do not share food, drink or utensils with others to also help prevent the spread and re-infection of strep.  You may take 500mg  acetaminophen every 4-6 hours or in combination with ibuprofen 400-600mg  every 6-8 hours as needed for pain, inflammation, and fever.  Be sure to well hydrated with clear liquids and get at least 8 hours of sleep at night, preferably more while sick.   Please follow up with family medicine in 1 week if needed.

## 2019-11-27 ENCOUNTER — Telehealth: Payer: Self-pay

## 2019-11-27 ENCOUNTER — Telehealth: Payer: Self-pay | Admitting: Emergency Medicine

## 2019-11-27 MED ORDER — AZITHROMYCIN 250 MG PO TABS: 250.0000 mg | ORAL_TABLET | Freq: Every day | ORAL | 0 refills | Status: DC

## 2019-11-27 NOTE — Telephone Encounter (Signed)
Returned patient's call regarding a request for an alternate antibiotic, states the one she is currently on made her nauseous. Provider aware, orders placed, patient updated and questions answered.

## 2019-11-27 NOTE — Telephone Encounter (Signed)
Pt requesting medication change due to GI upset on cefdinir prescribed for strep throat. Pt just completed a course of Augmentin, will change pt to azithromycin.

## 2020-04-04 ENCOUNTER — Other Ambulatory Visit: Payer: Self-pay | Admitting: Internal Medicine

## 2020-04-04 DIAGNOSIS — Z1231 Encounter for screening mammogram for malignant neoplasm of breast: Secondary | ICD-10-CM

## 2020-06-05 ENCOUNTER — Ambulatory Visit: Payer: BC Managed Care – PPO

## 2020-06-06 ENCOUNTER — Other Ambulatory Visit: Payer: Self-pay

## 2020-06-06 ENCOUNTER — Ambulatory Visit (INDEPENDENT_AMBULATORY_CARE_PROVIDER_SITE_OTHER): Payer: BC Managed Care – PPO

## 2020-06-06 DIAGNOSIS — Z1231 Encounter for screening mammogram for malignant neoplasm of breast: Secondary | ICD-10-CM

## 2020-07-08 ENCOUNTER — Other Ambulatory Visit: Payer: Self-pay

## 2020-07-08 ENCOUNTER — Ambulatory Visit (INDEPENDENT_AMBULATORY_CARE_PROVIDER_SITE_OTHER): Payer: BC Managed Care – PPO | Admitting: Sports Medicine

## 2020-07-08 ENCOUNTER — Ambulatory Visit (INDEPENDENT_AMBULATORY_CARE_PROVIDER_SITE_OTHER): Payer: BC Managed Care – PPO

## 2020-07-08 DIAGNOSIS — M170 Bilateral primary osteoarthritis of knee: Secondary | ICD-10-CM | POA: Diagnosis not present

## 2020-07-08 DIAGNOSIS — M1711 Unilateral primary osteoarthritis, right knee: Secondary | ICD-10-CM

## 2020-07-08 NOTE — Progress Notes (Signed)
    Procedures performed today:    Procedure: Real-time Ultrasound Guided injection of the right knee Device: Samsung HS60  Verbal informed consent obtained.  Time-out conducted.  Noted no overlying erythema, induration, or other signs of local infection.  Skin prepped in a sterile fashion.  Local anesthesia: Topical Ethyl chloride.  With sterile technique and under real time ultrasound guidance:  Noted lack of effusion, 1 cc Kenalog 40, 2 cc lidocaine, 2 cc bupivacaine injected easily Completed without difficulty  Advised to call if fevers/chills, erythema, induration, drainage, or persistent bleeding.  Images permanently stored and available for review in PACS.  Impression: Technically successful ultrasound guided injection.  Procedure: Real-time Ultrasound Guided injection of the left knee Device: Samsung HS60  Verbal informed consent obtained.  Time-out conducted.  Noted no overlying erythema, induration, or other signs of local infection.  Skin prepped in a sterile fashion.  Local anesthesia: Topical Ethyl chloride.  With sterile technique and under real time ultrasound guidance:  Noted lack of effusion, 1 cc Kenalog 40, 2 cc lidocaine, 2 cc bupivacaine injected easily Completed without difficulty  Advised to call if fevers/chills, erythema, induration, drainage, or persistent bleeding.  Images permanently stored and available for review in PACS.  Impression: Technically successful ultrasound guided injection.  Independent interpretation of notes and tests performed by another provider:   None.  Brief History, Exam, Impression, and Recommendations:    Primary osteoarthritis of both knees Increasing pain, bilateral knee injections today.   Return as needed    ___________________________________________ Gwen Her. Dianah Field, M.D., ABFM., CAQSM. Primary Care and [place] Instructor of [place] of  [facility] of Medicine

## 2020-07-08 NOTE — Assessment & Plan Note (Addendum)
Increasing pain, bilateral knee injections today.   Return as needed

## 2021-02-19 ENCOUNTER — Ambulatory Visit (INDEPENDENT_AMBULATORY_CARE_PROVIDER_SITE_OTHER): Payer: BC Managed Care – PPO

## 2021-02-19 ENCOUNTER — Ambulatory Visit: Payer: BC Managed Care – PPO | Admitting: Sports Medicine

## 2021-02-19 ENCOUNTER — Other Ambulatory Visit: Payer: Self-pay

## 2021-02-19 DIAGNOSIS — M5416 Radiculopathy, lumbar region: Secondary | ICD-10-CM

## 2021-02-19 DIAGNOSIS — M545 Low back pain, unspecified: Secondary | ICD-10-CM

## 2021-02-19 MED ORDER — PREDNISONE 50 MG PO TABS: ORAL_TABLET | ORAL | 0 refills | Status: DC

## 2021-02-19 NOTE — Progress Notes (Signed)
    Procedures performed today:    None.  Independent interpretation of notes and tests performed by another provider:   None.  Brief History, Exam, Impression, and Recommendations:    Right lumbar radiculopathy Vickie Castillo is a very pleasant 50 year old female, she has known lumbar DDD, history of right-sided lumbar radiculitis, historically right L4 and L5 selective epidurals have worked well. She is done well for several years but unfortunately is having recurrence of axial back pain, right-sided, worse with sitting, flexion, Valsalva. We will start conservatively with prednisone, home conditioning, updated x-rays per If insufficient improvement over 4 to 6 weeks we will proceed with an updated MRI for epidural planning.  Chronic process with exacerbation and pharmacologic intervention  ___________________________________________ Gwen Her. Dianah Field, M.D., ABFM., CAQSM. Primary Care and [place] Instructor of [place] of [facility] of Medicine

## 2021-02-19 NOTE — Assessment & Plan Note (Signed)
Vickie Castillo is a very pleasant 50 year old female, she has known lumbar DDD, history of right-sided lumbar radiculitis, historically right L4 and L5 selective epidurals have worked well. She is done well for several years but unfortunately is having recurrence of axial back pain, right-sided, worse with sitting, flexion, Valsalva. We will start conservatively with prednisone, home conditioning, updated x-rays per If insufficient improvement over 4 to 6 weeks we will proceed with an updated MRI for epidural planning.

## 2021-06-25 ENCOUNTER — Telehealth: Payer: Self-pay | Admitting: Sports Medicine

## 2021-06-25 ENCOUNTER — Ambulatory Visit: Payer: BC Managed Care – PPO | Admitting: Sports Medicine

## 2021-06-25 ENCOUNTER — Ambulatory Visit (INDEPENDENT_AMBULATORY_CARE_PROVIDER_SITE_OTHER): Payer: BC Managed Care – PPO

## 2021-06-25 ENCOUNTER — Other Ambulatory Visit: Payer: Self-pay

## 2021-06-25 DIAGNOSIS — M25561 Pain in right knee: Secondary | ICD-10-CM

## 2021-06-25 DIAGNOSIS — M25562 Pain in left knee: Secondary | ICD-10-CM | POA: Diagnosis not present

## 2021-06-25 DIAGNOSIS — M542 Cervicalgia: Secondary | ICD-10-CM

## 2021-06-25 DIAGNOSIS — M5030 Other cervical disc degeneration, unspecified cervical region: Secondary | ICD-10-CM

## 2021-06-25 DIAGNOSIS — M5412 Radiculopathy, cervical region: Secondary | ICD-10-CM | POA: Diagnosis not present

## 2021-06-25 DIAGNOSIS — G8929 Other chronic pain: Secondary | ICD-10-CM | POA: Diagnosis not present

## 2021-06-25 MED ORDER — GABAPENTIN 300 MG PO CAPS: ORAL_CAPSULE | ORAL | 3 refills | Status: DC

## 2021-06-25 NOTE — Assessment & Plan Note (Signed)
Neck pain with radiation down both arms, C7 distribution right worse than left, worse at night. Adding x-rays, formal physical therapy, Neurontin at night. Turn to see me in 6 weeks, MRI for interventional planning if no better.

## 2021-06-25 NOTE — Assessment & Plan Note (Addendum)
This is a pleasant 51 year old female, she has known osteoarthritis, she has some meniscal tearing noted in the past and postarthroscopy left-sided. She has had some steroid injections, she has had NSAIDs, analgesics, activity modification, nothing is working. She has had greater than 6 weeks of physician directed conservative treatment with by me as well as Dr. Laurance Flatten with [facility]. We will get updated x-rays today, bilateral MRIs. For insurance purposes she has bilateral swelling, tenderness at the joint lines and bilateral positive McMurray signs. I would also like her to get approved for Orthovisc or any other viscosupplementation. We can see her back to start Orthovisc when she gets approved. Of note she is also going to be starting Ozempic to help her lose a lot of weight.

## 2021-06-25 NOTE — Telephone Encounter (Signed)
Lets do bilateral viscosupplementation approval, preferably Orthovisc but any viscosupplementation fine, x-ray confirmed osteoarthritis, has failed oral NSAIDs, greater than 6 weeks of physician directed conservative treatment, bilateral steroid injections.

## 2021-06-25 NOTE — Progress Notes (Signed)
° ° °  Procedures performed today:    None.  Independent interpretation of notes and tests performed by another provider:   None.  Brief History, Exam, Impression, and Recommendations:    Bilateral chronic knee pain This is a pleasant 51 year old female, she has known osteoarthritis, she has some meniscal tearing noted in the past and postarthroscopy left-sided. She has had some steroid injections, she has had NSAIDs, analgesics, activity modification, nothing is working. She has had greater than 6 weeks of physician directed conservative treatment with by me as well as Dr. Laurance Flatten with [facility]. We will get updated x-rays today, bilateral MRIs. For insurance purposes she has bilateral swelling, tenderness at the joint lines and bilateral positive McMurray signs. I would also like her to get approved for Orthovisc or any other viscosupplementation. We can see her back to start Orthovisc when she gets approved. Of note she is also going to be starting Ozempic to help her lose a lot of weight.  DDD (degenerative disc disease), cervical Neck pain with radiation down both arms, C7 distribution right worse than left, worse at night. Adding x-rays, formal physical therapy, Neurontin at night. Turn to see me in 6 weeks, MRI for interventional planning if no better.    ___________________________________________ Gwen Her. Dianah Field, M.D., ABFM., CAQSM. Primary Care and [place] Instructor of [place] of [facility] of Medicine

## 2021-06-26 NOTE — Telephone Encounter (Signed)
[employer] paperwork faxed to [employer] at [phone] Request is for Orthovisc or Monovisc Pt's insurance prefers Orthovisc Fax confirmation receipt received

## 2021-07-02 NOTE — Telephone Encounter (Signed)
Faxed a copy of front and back of insurance card on file to [employer] to complete the benefits investigation.

## 2021-07-04 ENCOUNTER — Telehealth: Payer: Self-pay

## 2021-07-04 DIAGNOSIS — M5030 Other cervical disc degeneration, unspecified cervical region: Secondary | ICD-10-CM

## 2021-07-04 MED ORDER — PREGABALIN 25 MG PO CAPS: 25.0000 mg | ORAL_CAPSULE | Freq: Two times a day (BID) | ORAL | 3 refills | Status: DC

## 2021-07-04 NOTE — Telephone Encounter (Signed)
Switched to our location and switching to Lyrica.

## 2021-07-04 NOTE — Telephone Encounter (Signed)
Patient called to report that she couldn't get in with her PT until March and asked if we could refer her to our location here so she can maybe get started sooner.   She stated that the Gabapentin made her sick to her stomach so she is not taking it.

## 2021-07-04 NOTE — Telephone Encounter (Signed)
Patient aware of change in location and new medication.

## 2021-07-07 ENCOUNTER — Other Ambulatory Visit: Payer: BC Managed Care – PPO

## 2021-07-07 NOTE — Telephone Encounter (Signed)
Benefits Investigation Details received from [employer] Injection: Orthovisc  Medical: Decudtible does not apply. Once the OOP has been met, patient is covered at 100%. Only one copay applies per date of service. Prior authorization is required for the drug through [place] [employer] [place]. To initiate, fax the provided form to [phone] PA required: Yes  PA form and medical records faxed to [facility] at [phone] (number of PA form)  Fax confirmation received  Pharmacy: Benefits are currently undisclosed until a PA has been obtained. To initiate, contact [employer] via phone at [phone]  Specialty Pharmacy: [facility]  May fill through: [place] Copay/Coinsurance:  Product Copay: $80 Administration Coinsurance:  Administration Copay:  Deductible:  Out of Pocket Max: [phone] (met: $105)    Awaiting response from [employer] re: PA

## 2021-07-10 NOTE — Telephone Encounter (Signed)
PA determination received from [facility] PA has been approved for Orthovisc from 07/07/21 through 01/03/22 fo 8 visits.   Left msg for a return call from patient to discuss copay.

## 2021-07-11 NOTE — Telephone Encounter (Signed)
Left detailed information regarding copay on patient's VM. Asked patient to return call to let us know if she wishes to proceed.

## 2021-07-12 ENCOUNTER — Other Ambulatory Visit: Payer: BC Managed Care – PPO

## 2021-07-14 ENCOUNTER — Ambulatory Visit (INDEPENDENT_AMBULATORY_CARE_PROVIDER_SITE_OTHER): Payer: BC Managed Care – PPO

## 2021-07-14 ENCOUNTER — Ambulatory Visit: Payer: BC Managed Care – PPO

## 2021-07-14 ENCOUNTER — Other Ambulatory Visit: Payer: Self-pay

## 2021-07-14 DIAGNOSIS — M25561 Pain in right knee: Secondary | ICD-10-CM

## 2021-07-14 DIAGNOSIS — G8929 Other chronic pain: Secondary | ICD-10-CM | POA: Diagnosis not present

## 2021-07-14 DIAGNOSIS — M25562 Pain in left knee: Secondary | ICD-10-CM

## 2021-07-17 ENCOUNTER — Telehealth: Payer: Self-pay

## 2021-07-17 NOTE — Telephone Encounter (Signed)
Patient calling for MRI results. She also questioned if based on the MRI results, does she needs to get started on the gel injections.   **Already approved**

## 2021-07-18 NOTE — Telephone Encounter (Signed)
MRI does show bilateral knee osteoarthritis and meniscal tearing, she is a candidate for the gel injections if she is not having significant mechanical symptoms.  Mechanical symptoms would be locking, catching, buckling.  If she is having mechanical symptoms then it is prudent to do an arthroscopy to clean things out before doing the gel injections

## 2021-07-18 NOTE — Telephone Encounter (Signed)
Patient aware of the results and recommendations of Dr. Darene Lamer. She will pick up a disc of her images to take to Dr. Laurance Flatten at [place].

## 2021-10-17 ENCOUNTER — Other Ambulatory Visit: Payer: Self-pay | Admitting: Internal Medicine

## 2021-10-17 DIAGNOSIS — Z1231 Encounter for screening mammogram for malignant neoplasm of breast: Secondary | ICD-10-CM

## 2021-10-22 ENCOUNTER — Ambulatory Visit: Payer: BC Managed Care – PPO

## 2021-11-05 ENCOUNTER — Ambulatory Visit: Payer: BC Managed Care – PPO

## 2021-12-11 ENCOUNTER — Ambulatory Visit (INDEPENDENT_AMBULATORY_CARE_PROVIDER_SITE_OTHER): Payer: BC Managed Care – PPO

## 2021-12-11 DIAGNOSIS — Z1231 Encounter for screening mammogram for malignant neoplasm of breast: Secondary | ICD-10-CM

## 2022-01-19 ENCOUNTER — Ambulatory Visit: Payer: BC Managed Care – PPO | Admitting: Sports Medicine

## 2022-01-20 ENCOUNTER — Ambulatory Visit: Payer: BC Managed Care – PPO | Admitting: Sports Medicine

## 2022-01-20 DIAGNOSIS — S93491A Sprain of other ligament of right ankle, initial encounter: Secondary | ICD-10-CM

## 2022-01-20 DIAGNOSIS — S93401A Sprain of unspecified ligament of right ankle, initial encounter: Secondary | ICD-10-CM | POA: Insufficient documentation

## 2022-01-20 MED ORDER — IBUPROFEN 800 MG PO TABS: 800.0000 mg | ORAL_TABLET | Freq: Three times a day (TID) | ORAL | 2 refills | Status: DC | PRN

## 2022-01-20 NOTE — Assessment & Plan Note (Signed)
Recent inversion injury, tenderness over the fibula and the ATFL, x-rays done in the ED negative for fracture, continue ASO, increasing ibuprofen to 800 mg 3 times a day, adding home conditioning, return to see me in 4 weeks.

## 2022-01-20 NOTE — Progress Notes (Signed)
    Procedures performed today:    None.  Independent interpretation of notes and tests performed by another provider:   None.  Brief History, Exam, Impression, and Recommendations:    Right ankle sprain Recent inversion injury, tenderness over the fibula and the ATFL, x-rays done in the ED negative for fracture, continue ASO, increasing ibuprofen to 800 mg 3 times a day, adding home conditioning, return to see me in 4 weeks.    ____________________________________________ Gwen Her. Dianah Field, M.D., ABFM., CAQSM., AME. Primary Care and Sports Medicine [facility]  Adjunct Professor of [place] of [facility] of Medicine  Risk manager

## 2022-09-07 ENCOUNTER — Encounter: Payer: Self-pay | Admitting: *Deleted

## 2022-10-15 ENCOUNTER — Telehealth: Payer: Self-pay | Admitting: Sports Medicine

## 2022-10-15 ENCOUNTER — Ambulatory Visit (INDEPENDENT_AMBULATORY_CARE_PROVIDER_SITE_OTHER): Payer: BC Managed Care – PPO

## 2022-10-15 ENCOUNTER — Ambulatory Visit: Payer: BC Managed Care – PPO | Admitting: Sports Medicine

## 2022-10-15 DIAGNOSIS — M25511 Pain in right shoulder: Secondary | ICD-10-CM | POA: Diagnosis not present

## 2022-10-15 DIAGNOSIS — G8929 Other chronic pain: Secondary | ICD-10-CM

## 2022-10-15 DIAGNOSIS — M7541 Impingement syndrome of right shoulder: Secondary | ICD-10-CM

## 2022-10-15 DIAGNOSIS — M25562 Pain in left knee: Secondary | ICD-10-CM

## 2022-10-15 DIAGNOSIS — M75101 Unspecified rotator cuff tear or rupture of right shoulder, not specified as traumatic: Secondary | ICD-10-CM | POA: Insufficient documentation

## 2022-10-15 DIAGNOSIS — M25561 Pain in right knee: Secondary | ICD-10-CM

## 2022-10-15 MED ORDER — SEMAGLUTIDE (2 MG/DOSE) 8 MG/3ML ~~LOC~~ SOPN
PEN_INJECTOR | SUBCUTANEOUS | 3 refills | Status: DC
Start: 2022-10-15 — End: 2023-08-31

## 2022-10-15 MED ORDER — CELECOXIB 200 MG PO CAPS: ORAL_CAPSULE | ORAL | 2 refills | Status: DC

## 2022-10-15 MED ORDER — SEMAGLUTIDE (2 MG/DOSE) 8 MG/3ML ~~LOC~~ SOPN
PEN_INJECTOR | SUBCUTANEOUS | 3 refills | Status: DC
Start: 2022-10-15 — End: 2022-10-15

## 2022-10-15 MED ORDER — SEMAGLUTIDE (2 MG/DOSE) 8 MG/3ML ~~LOC~~ SOPN: PEN_INJECTOR | SUBCUTANEOUS | 3 refills | Status: DC

## 2022-10-15 NOTE — Assessment & Plan Note (Signed)
Initially had good response to Ozempic, unfortunately no longer covered, we will go and send her generic compounded semaglutide, she can follow this up with her PCP.

## 2022-10-15 NOTE — Assessment & Plan Note (Signed)
This is a pleasant 52 year old female, she has no knee osteoarthritis, she responded well to conservative treatment in the past, now having recurrence of pain that she has been exercising and losing weight. She has not done sufficiently well with steroid injections in the past. She does have ibuprofen 800 which seems to help but the dosing is inconvenient. Switching to Celebrex, we will do some formal therapy and get updated x-rays. She does request that we go and get her approved for gel shots.

## 2022-10-15 NOTE — Progress Notes (Signed)
    Procedures performed today:    None.  Independent interpretation of notes and tests performed by another provider:   None.  Brief History, Exam, Impression, and Recommendations:    Bilateral chronic knee pain This is a pleasant 52 year old female, she has no knee osteoarthritis, she responded well to conservative treatment in the past, now having recurrence of pain that she has been exercising and losing weight. She has not done sufficiently well with steroid injections in the past. She does have ibuprofen 800 which seems to help but the dosing is inconvenient. Switching to Celebrex, we will do some formal therapy and get updated x-rays. She does request that we go and get her approved for gel shots.  Impingement syndrome, shoulder, right Pain over the deltoid impingement signs on exam, adding x-rays and PT. Return to see me in 6 weeks, subacromial injection if not better.  Morbid obesity (HCC) Initially had good response to Ozempic, unfortunately no longer covered, we will go and send her generic compounded semaglutide, she can follow this up with her PCP.    ____________________________________________ Ihor Austin. Benjamin Stain, M.D., ABFM., CAQSM., AME. Primary Care and Sports Medicine [facility]  Adjunct Professor of Family Medicine  [place] of [facility] of Medicine  Restaurant manager, fast food

## 2022-10-15 NOTE — Telephone Encounter (Signed)
Visco approval please, bilateral, XR confirmed, failed >6 weeks PT.

## 2022-10-15 NOTE — Assessment & Plan Note (Signed)
Pain over the deltoid impingement signs on exam, adding x-rays and PT. Return to see me in 6 weeks, subacromial injection if not better.

## 2022-10-23 NOTE — Telephone Encounter (Signed)
PA information submitted via [email or web address] for Orthovisc Paperwork has been printed and given to Dr. T for signatures. Once obtained, information will be faxed to [employer] at [phone]  

## 2022-10-28 ENCOUNTER — Telehealth: Payer: Self-pay

## 2022-10-28 NOTE — Telephone Encounter (Signed)
Benefits Investigation Details received from [employer]Vis[employer] Injection: Orthovisc PA required: Yes PA form and medical records faxed to  [phone] at 1:55  Fax confirmation received at 2:01 May fill through: [facility] [place] [facility] [place] Copay/Coinsurance: 0% Product Copay: $94 Administration Coinsurance: 0% Administration Copay: 0% Deductible: $5900 (met: $3077.90) Out of Pocket Max: [id number] (met: $0981.19)

## 2022-10-28 NOTE — Telephone Encounter (Signed)
Patient into office to drop off insurance form to be completed by Dr.T, forms place in Dr. Melvia Heaps box, thanks.

## 2022-10-29 NOTE — Telephone Encounter (Signed)
Paperwork finished and placed in my box for copying, scanning, faxing

## 2022-10-30 NOTE — Telephone Encounter (Signed)
Patient notified of approval and will call back and two weeks and get scheduled and we can order the syringes task complete.

## 2022-11-27 ENCOUNTER — Ambulatory Visit: Payer: BC Managed Care – PPO | Admitting: Sports Medicine

## 2022-11-30 ENCOUNTER — Ambulatory Visit (INDEPENDENT_AMBULATORY_CARE_PROVIDER_SITE_OTHER): Payer: BC Managed Care – PPO | Admitting: Sports Medicine

## 2022-11-30 DIAGNOSIS — M25561 Pain in right knee: Secondary | ICD-10-CM

## 2022-11-30 DIAGNOSIS — G8929 Other chronic pain: Secondary | ICD-10-CM

## 2022-11-30 DIAGNOSIS — M7541 Impingement syndrome of right shoulder: Secondary | ICD-10-CM

## 2022-11-30 DIAGNOSIS — M25562 Pain in left knee: Secondary | ICD-10-CM

## 2022-11-30 NOTE — Assessment & Plan Note (Signed)
As above a lot better with physical therapy and Celebrex, she has not yet plateaued so we will watch this with an open-ended 6 to 8-week follow-up.

## 2022-11-30 NOTE — Progress Notes (Signed)
    Procedures performed today:    None.  Independent interpretation of notes and tests performed by another provider:   None.  Brief History, Exam, Impression, and Recommendations:    Bilateral chronic knee pain Pleasant 52 year old female with known knee osteoarthritis, increasing pain in spite of steroid injections, we switched to Celebrex which seems to be helping quite a bit, she also did some formal therapy which has been effective, we did start the Visco approval, but at this point she does not feel like she needs it. We will set a 6 to 8-week open-ended follow-up.  Impingement syndrome, shoulder, right As above a lot better with physical therapy and Celebrex, she has not yet plateaued so we will watch this with an open-ended 6 to 8-week follow-up.    ____________________________________________ Ihor Austin. Benjamin Stain, M.D., ABFM., CAQSM., AME. Primary Care and Sports Medicine [facility]  Adjunct Professor of Family Medicine  [place] [employer] [facility] [employer]  Restaurant manager, fast food

## 2022-11-30 NOTE — Assessment & Plan Note (Signed)
Pleasant 52 year old female with known knee osteoarthritis, increasing pain in spite of steroid injections, we switched to Celebrex which seems to be helping quite a bit, she also did some formal therapy which has been effective, we did start the Visco approval, but at this point she does not feel like she needs it. We will set a 6 to 8-week open-ended follow-up.

## 2023-01-14 ENCOUNTER — Other Ambulatory Visit (INDEPENDENT_AMBULATORY_CARE_PROVIDER_SITE_OTHER): Payer: BC Managed Care – PPO

## 2023-01-14 ENCOUNTER — Other Ambulatory Visit: Payer: Self-pay | Admitting: Medical Genetics

## 2023-01-14 ENCOUNTER — Encounter: Payer: Self-pay | Admitting: Sports Medicine

## 2023-01-14 ENCOUNTER — Ambulatory Visit (INDEPENDENT_AMBULATORY_CARE_PROVIDER_SITE_OTHER): Payer: BC Managed Care – PPO | Admitting: Sports Medicine

## 2023-01-14 DIAGNOSIS — M7541 Impingement syndrome of right shoulder: Secondary | ICD-10-CM

## 2023-01-14 DIAGNOSIS — Z006 Encounter for examination for normal comparison and control in clinical research program: Secondary | ICD-10-CM

## 2023-01-14 NOTE — Progress Notes (Signed)
    Procedures performed today:    Procedure: Real-time Ultrasound Guided injection of the right subacromial bursa Device: Samsung HS60  Verbal informed consent obtained.  Time-out conducted.  Noted no overlying erythema, induration, or other signs of local infection.  Skin prepped in a sterile fashion.  Local anesthesia: Topical Ethyl chloride.  With sterile technique and under real time ultrasound guidance: Noted grossly intact rotator cuff, 1 cc Kenalog 40, 1 cc lidocaine, 1 cc bupivacaine injected easily Completed without difficulty  Advised to call if fevers/chills, erythema, induration, drainage, or persistent bleeding.  Images permanently stored and available for review in PACS.  Impression: Technically successful ultrasound guided injection.  Independent interpretation of notes and tests performed by another provider:   None.  Brief History, Exam, Impression, and Recommendations:    Impingement syndrome, shoulder, right Initially had an improvement with therapy, now with recurrence of pain, we did a subacromial injection today, return to see me in 6 weeks.  Chronic process with exacerbation/not at goal with pharmacologic intervention  ____________________________________________ Ihor Austin. Benjamin Stain, M.D., ABFM., CAQSM., AME. Primary Care and Sports Medicine [facility]  Adjunct Professor of Family Medicine  [place] of [facility] [employer]  Restaurant manager, fast food

## 2023-01-14 NOTE — Assessment & Plan Note (Signed)
Initially had an improvement with therapy, now with recurrence of pain, we did a subacromial injection today, return to see me in 6 weeks.

## 2023-01-18 DIAGNOSIS — M7541 Impingement syndrome of right shoulder: Secondary | ICD-10-CM

## 2023-01-18 MED ORDER — TRIAMCINOLONE ACETONIDE 40 MG/ML IJ SUSP
40.0000 mg | Freq: Once | INTRAMUSCULAR | Status: AC
Start: 2023-01-18 — End: 2023-01-18
  Administered 2023-01-18: 40 mg via INTRAMUSCULAR

## 2023-01-18 NOTE — Addendum Note (Signed)
Addended by: Holland Falling on: 01/18/2023 08:23 AM   Modules accepted: Orders

## 2023-01-19 ENCOUNTER — Ambulatory Visit: Payer: BC Managed Care – PPO | Admitting: Sports Medicine

## 2023-02-25 ENCOUNTER — Ambulatory Visit: Payer: BC Managed Care – PPO | Admitting: Sports Medicine

## 2023-05-11 ENCOUNTER — Other Ambulatory Visit (INDEPENDENT_AMBULATORY_CARE_PROVIDER_SITE_OTHER): Payer: BC Managed Care – PPO

## 2023-05-11 ENCOUNTER — Ambulatory Visit: Payer: BC Managed Care – PPO | Admitting: Sports Medicine

## 2023-05-11 ENCOUNTER — Encounter: Payer: Self-pay | Admitting: Sports Medicine

## 2023-05-11 DIAGNOSIS — M7541 Impingement syndrome of right shoulder: Secondary | ICD-10-CM | POA: Diagnosis not present

## 2023-05-11 MED ORDER — TRIAMCINOLONE ACETONIDE 40 MG/ML IJ SUSP
40.0000 mg | Freq: Once | INTRAMUSCULAR | Status: AC
  Administered 2023-05-11: 40 mg via INTRAMUSCULAR

## 2023-05-11 NOTE — Assessment & Plan Note (Addendum)
Very pleasant 53 year old female, chronic right shoulder pain, impingement signs on exam, left subacromial injection was in August, having recurrence of discomfort, repeated today. She will get back to being consistent with her home physical therapy. Return to see me as needed.

## 2023-05-11 NOTE — Progress Notes (Signed)
    Procedures performed today:    Procedure: Real-time Ultrasound Guided injection of the right subacromial bursa Device: Samsung HS60  Verbal informed consent obtained.  Time-out conducted.  Noted no overlying erythema, induration, or other signs of local infection.  Skin prepped in a sterile fashion.  Local anesthesia: Topical Ethyl chloride.  With sterile technique and under real time ultrasound guidance: Noted grossly intact rotator cuff, 1 cc Kenalog 40, 1 cc lidocaine, 1 cc bupivacaine injected easily Completed without difficulty  Advised to call if fevers/chills, erythema, induration, drainage, or persistent bleeding.  Images permanently stored and available for review in PACS.  Impression: Technically successful ultrasound guided injection.  Independent interpretation of notes and tests performed by another provider:   None.  Brief History, Exam, Impression, and Recommendations:    Impingement syndrome, shoulder, right Very pleasant 52 year old female, chronic right shoulder pain, impingement signs on exam, left subacromial injection was in August, having recurrence of discomfort, repeated today. She will get back to being consistent with her home physical therapy. Return to see me as needed.    ____________________________________________ Ihor Austin. Benjamin Stain, M.D., ABFM., CAQSM., AME. Primary Care and Sports Medicine [facility]  Adjunct Professor of Family Medicine  [place] of [facility] [employer]  Restaurant manager, fast food

## 2023-08-04 ENCOUNTER — Other Ambulatory Visit: Payer: Self-pay | Admitting: Internal Medicine

## 2023-08-04 DIAGNOSIS — Z1231 Encounter for screening mammogram for malignant neoplasm of breast: Secondary | ICD-10-CM

## 2023-08-12 ENCOUNTER — Ambulatory Visit: Payer: Self-pay

## 2023-08-12 DIAGNOSIS — Z1231 Encounter for screening mammogram for malignant neoplasm of breast: Secondary | ICD-10-CM

## 2023-08-31 ENCOUNTER — Encounter: Payer: Self-pay | Admitting: Sports Medicine

## 2023-08-31 ENCOUNTER — Ambulatory Visit (INDEPENDENT_AMBULATORY_CARE_PROVIDER_SITE_OTHER): Payer: Self-pay | Admitting: Sports Medicine

## 2023-08-31 ENCOUNTER — Ambulatory Visit

## 2023-08-31 DIAGNOSIS — M5030 Other cervical disc degeneration, unspecified cervical region: Secondary | ICD-10-CM

## 2023-08-31 DIAGNOSIS — M25511 Pain in right shoulder: Secondary | ICD-10-CM | POA: Diagnosis not present

## 2023-08-31 DIAGNOSIS — M542 Cervicalgia: Secondary | ICD-10-CM | POA: Diagnosis not present

## 2023-08-31 DIAGNOSIS — M545 Low back pain, unspecified: Secondary | ICD-10-CM

## 2023-08-31 DIAGNOSIS — M5416 Radiculopathy, lumbar region: Secondary | ICD-10-CM

## 2023-08-31 MED ORDER — PREDNISONE 50 MG PO TABS
ORAL_TABLET | ORAL | 0 refills | Status: AC
Start: 2023-08-31 — End: ?

## 2023-08-31 MED ORDER — TIZANIDINE HCL 4 MG PO TABS: 4.0000 mg | ORAL_TABLET | Freq: Three times a day (TID) | ORAL | 2 refills | Status: AC | PRN
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
## 2023-08-31 NOTE — Addendum Note (Signed)
 Addended by: Samule Dry on: 08/31/2023 02:19 PM   Modules accepted: Orders

## 2023-09-09 ENCOUNTER — Encounter (INDEPENDENT_AMBULATORY_CARE_PROVIDER_SITE_OTHER): Payer: Self-pay | Admitting: Sports Medicine

## 2023-09-09 DIAGNOSIS — M5030 Other cervical disc degeneration, unspecified cervical region: Secondary | ICD-10-CM

## 2023-09-09 DIAGNOSIS — M7541 Impingement syndrome of right shoulder: Secondary | ICD-10-CM | POA: Diagnosis not present

## 2023-09-13 NOTE — Telephone Encounter (Addendum)
Please see the MyChart message reply(ies) for my assessment and plan.    This patient gave consent for this Medical Advice Message and is aware that it may result in a bill to [employer], as well as the possibility of receiving a bill for a co-payment or deductible. They are an established patient, but are not seeking medical advice exclusively about a problem treated during an in person or video visit in the last seven days. I did not recommend an in person or video visit within seven days of my reply.  I spent 11 total minutes of online digital evaluation and management services in this patient-initiated request for online care.

## 2023-09-16 NOTE — Addendum Note (Signed)
 Addended by: Gean Keels on: 09/16/2023 02:33 PM   Modules accepted: Orders

## 2023-09-19 ENCOUNTER — Ambulatory Visit

## 2023-09-19 DIAGNOSIS — R202 Paresthesia of skin: Secondary | ICD-10-CM | POA: Diagnosis not present

## 2023-09-19 DIAGNOSIS — M5030 Other cervical disc degeneration, unspecified cervical region: Secondary | ICD-10-CM

## 2023-09-19 DIAGNOSIS — M7541 Impingement syndrome of right shoulder: Secondary | ICD-10-CM

## 2023-09-19 DIAGNOSIS — M25511 Pain in right shoulder: Secondary | ICD-10-CM | POA: Diagnosis not present

## 2023-09-28 ENCOUNTER — Ambulatory Visit (INDEPENDENT_AMBULATORY_CARE_PROVIDER_SITE_OTHER): Admitting: Sports Medicine

## 2023-09-28 DIAGNOSIS — M7541 Impingement syndrome of right shoulder: Secondary | ICD-10-CM | POA: Diagnosis not present

## 2023-09-28 DIAGNOSIS — M75101 Unspecified rotator cuff tear or rupture of right shoulder, not specified as traumatic: Secondary | ICD-10-CM | POA: Diagnosis not present

## 2023-09-28 MED ORDER — MELOXICAM 15 MG PO TABS: ORAL_TABLET | ORAL | 3 refills | Status: AC

## 2023-09-28 MED ORDER — TRAMADOL HCL 50 MG PO TABS: 50.0000 mg | ORAL_TABLET | Freq: Three times a day (TID) | ORAL | 0 refills | Status: AC | PRN

## 2023-09-28 NOTE — Assessment & Plan Note (Signed)
 Very pleasant 53 year old female, we have been treating her longitudinally for several months, she has chronic shoulder pain right sided localized over the deltoid without trauma, impingement signs on exam, we did a subacromial injection in August and then in December of last year. She has done activity modification, physical therapy, unfortunately she has not improved, we did obtain an MRI, official report is not back yet but I do see a large full-thickness retracted supraspinatus tear approximately 11 mm of retraction Due to failure of conservative treatment we will proceed with consultation with Dr. Yvonne Hering for surgical repair, we will also do meloxicam  and tramadol  for pain in the meantime.

## 2023-09-28 NOTE — Progress Notes (Signed)
    Procedures performed today:    None.  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  Brief History, Exam, Impression, and Recommendations:    Tear of right supraspinatus tendon Very pleasant 53 year old female, we have been treating her longitudinally for several months, she has chronic shoulder pain right sided localized over the deltoid without trauma, impingement signs on exam, we did a subacromial injection in August and then in December of last year. She has done activity modification, physical therapy, unfortunately she has not improved, we did obtain an MRI, official report is not back yet but I do see a large full-thickness retracted supraspinatus tear approximately 11 mm of retraction Due to failure of conservative treatment we will proceed with consultation with Dr. Yvonne Hering for surgical repair, we will also do meloxicam  and tramadol  for pain in the meantime.    ____________________________________________ Joselyn Nicely. Sandy Crumb, M.D., ABFM., CAQSM., AME. Primary Care and Sports Medicine [facility]  Adjunct Professor of [facility] [employer] Medicine  [employer]  Restaurant manager, fast food

## 2023-10-04 ENCOUNTER — Telehealth: Payer: Self-pay | Admitting: Internal Medicine

## 2023-10-04 NOTE — Telephone Encounter (Signed)
 Patient returned your call.

## 2023-10-05 ENCOUNTER — Ambulatory Visit: Admitting: Sports Medicine

## 2023-10-06 ENCOUNTER — Ambulatory Visit: Payer: Self-pay | Admitting: Sports Medicine

## 2024-01-14 ENCOUNTER — Telehealth: Payer: Self-pay | Admitting: Internal Medicine

## 2024-01-14 NOTE — Telephone Encounter (Signed)
 Visco approval was submitted more than one year ago. A new benefits investigation has to be submitted and approved before moving forward. Okay to restart Orthovisc approval process?  Patient has been advised.

## 2024-01-14 NOTE — Telephone Encounter (Signed)
 Patient called. She has scheduled an appt for 8/25 to get a cortizone shot to left knee.  She wants to know if we can go ahead and get approval for her to get gel shots.

## 2024-01-14 NOTE — Telephone Encounter (Signed)
 Benefits Investigation Details received from [employer]Vis[employer] Injection: Orthovisc/Monovisc/Synvisc PA required: No May fill through: [facility] [place] [facility] [place] Copay/Coinsurance: % Product Copay: % Administration Coinsurance: % Administration Copay: $ Deductible: $  New benefits investigation started!

## 2024-01-14 NOTE — Telephone Encounter (Signed)
 Yes okay to restart

## 2024-01-17 ENCOUNTER — Ambulatory Visit: Admitting: Sports Medicine

## 2024-01-19 NOTE — Telephone Encounter (Signed)
 Benefits Investigation Details received from [employer] Injection: Orthovisc  Medical: Deductible does not apply. Since the OOP has been met, pt is covered at 100%.  PA required: Yes  PA form and medical records faxed to [facility] at [phone]  Fax confirmation received  Pharmacy: Product is not covered under the pharmacy plan  Specialty Pharmacy: [facility]  May fill through: [facility] and [place] [facility] [place] Copay/Coinsurance: 0% Product Copay: 0% Administration Coinsurance: 0% Administration Copay: 0% Deductible:  Out of Pocket Max: [id number] (met: $4890)

## 2024-01-25 ENCOUNTER — Encounter: Payer: Self-pay | Admitting: Sports Medicine

## 2024-03-20 ENCOUNTER — Other Ambulatory Visit: Payer: Self-pay | Admitting: Medical Genetics

## 2024-03-20 DIAGNOSIS — Z006 Encounter for examination for normal comparison and control in clinical research program: Secondary | ICD-10-CM
